# Patient Record
Sex: Female | Born: 1950
Health system: Southern US, Community
[De-identification: ages and names within clinical notes are randomized; demographics above are authoritative.]

## PROBLEM LIST (undated history)

## (undated) HISTORY — PX: FOOT SURGERY: SHX648

---

## 1998-12-27 ENCOUNTER — Other Ambulatory Visit: Admission: RE | Admit: 1998-12-27 | Discharge: 1998-12-27 | Payer: Self-pay | Admitting: Obstetrics and Gynecology

## 1999-12-24 ENCOUNTER — Encounter: Payer: Self-pay | Admitting: Family Medicine

## 1999-12-24 ENCOUNTER — Encounter: Admission: RE | Admit: 1999-12-24 | Discharge: 1999-12-24 | Payer: Self-pay | Admitting: Family Medicine

## 2000-01-06 ENCOUNTER — Encounter: Payer: Self-pay | Admitting: Family Medicine

## 2000-01-06 ENCOUNTER — Encounter: Admission: RE | Admit: 2000-01-06 | Discharge: 2000-01-06 | Payer: Self-pay | Admitting: Family Medicine

## 2000-01-17 ENCOUNTER — Encounter: Payer: Self-pay | Admitting: Obstetrics and Gynecology

## 2000-01-17 ENCOUNTER — Encounter: Admission: RE | Admit: 2000-01-17 | Discharge: 2000-01-17 | Payer: Self-pay | Admitting: Obstetrics and Gynecology

## 2000-01-21 ENCOUNTER — Other Ambulatory Visit: Admission: RE | Admit: 2000-01-21 | Discharge: 2000-01-21 | Payer: Self-pay | Admitting: Obstetrics and Gynecology

## 2000-02-05 ENCOUNTER — Ambulatory Visit (HOSPITAL_COMMUNITY): Admission: RE | Admit: 2000-02-05 | Discharge: 2000-02-05 | Payer: Self-pay | Admitting: Obstetrics and Gynecology

## 2000-04-30 ENCOUNTER — Ambulatory Visit (HOSPITAL_COMMUNITY): Admission: RE | Admit: 2000-04-30 | Discharge: 2000-04-30 | Payer: Self-pay | Admitting: Gastroenterology

## 2001-01-27 ENCOUNTER — Other Ambulatory Visit: Admission: RE | Admit: 2001-01-27 | Discharge: 2001-01-27 | Payer: Self-pay | Admitting: Obstetrics and Gynecology

## 2001-01-27 ENCOUNTER — Encounter: Admission: RE | Admit: 2001-01-27 | Discharge: 2001-01-27 | Payer: Self-pay | Admitting: Obstetrics and Gynecology

## 2001-01-27 ENCOUNTER — Encounter: Payer: Self-pay | Admitting: Obstetrics and Gynecology

## 2002-01-24 ENCOUNTER — Encounter: Payer: Self-pay | Admitting: Obstetrics and Gynecology

## 2002-01-24 ENCOUNTER — Encounter: Admission: RE | Admit: 2002-01-24 | Discharge: 2002-01-24 | Payer: Self-pay | Admitting: Obstetrics and Gynecology

## 2003-01-30 ENCOUNTER — Encounter: Admission: RE | Admit: 2003-01-30 | Discharge: 2003-01-30 | Payer: Self-pay | Admitting: Obstetrics and Gynecology

## 2003-01-30 ENCOUNTER — Encounter: Payer: Self-pay | Admitting: Obstetrics and Gynecology

## 2004-01-31 ENCOUNTER — Encounter: Admission: RE | Admit: 2004-01-31 | Discharge: 2004-01-31 | Payer: Self-pay | Admitting: Obstetrics and Gynecology

## 2004-08-20 ENCOUNTER — Encounter: Admission: RE | Admit: 2004-08-20 | Discharge: 2004-08-20 | Payer: Self-pay | Admitting: Family Medicine

## 2005-02-14 ENCOUNTER — Encounter: Admission: RE | Admit: 2005-02-14 | Discharge: 2005-02-14 | Payer: Self-pay | Admitting: Obstetrics and Gynecology

## 2005-07-13 ENCOUNTER — Emergency Department (HOSPITAL_COMMUNITY): Admission: EM | Admit: 2005-07-13 | Discharge: 2005-07-13 | Payer: Self-pay | Admitting: Family Medicine

## 2006-03-11 ENCOUNTER — Encounter: Admission: RE | Admit: 2006-03-11 | Discharge: 2006-03-11 | Payer: Self-pay | Admitting: Obstetrics and Gynecology

## 2007-05-31 ENCOUNTER — Emergency Department (HOSPITAL_COMMUNITY): Admission: EM | Admit: 2007-05-31 | Discharge: 2007-05-31 | Payer: Self-pay | Admitting: Family Medicine

## 2007-07-05 ENCOUNTER — Encounter: Admission: RE | Admit: 2007-07-05 | Discharge: 2007-07-05 | Payer: Self-pay | Admitting: Obstetrics and Gynecology

## 2007-09-08 ENCOUNTER — Encounter: Admission: RE | Admit: 2007-09-08 | Discharge: 2007-09-08 | Payer: Self-pay | Admitting: Family Medicine

## 2007-09-15 ENCOUNTER — Encounter (INDEPENDENT_AMBULATORY_CARE_PROVIDER_SITE_OTHER): Payer: Self-pay | Admitting: Interventional Radiology

## 2007-09-15 ENCOUNTER — Encounter: Admission: RE | Admit: 2007-09-15 | Discharge: 2007-09-15 | Payer: Self-pay | Admitting: Family Medicine

## 2007-09-15 ENCOUNTER — Other Ambulatory Visit: Admission: RE | Admit: 2007-09-15 | Discharge: 2007-09-15 | Payer: Self-pay | Admitting: Interventional Radiology

## 2008-07-06 ENCOUNTER — Encounter: Admission: RE | Admit: 2008-07-06 | Discharge: 2008-07-06 | Payer: Self-pay | Admitting: Obstetrics and Gynecology

## 2008-09-07 ENCOUNTER — Encounter: Admission: RE | Admit: 2008-09-07 | Discharge: 2008-09-07 | Payer: Self-pay | Admitting: Family Medicine

## 2008-09-25 ENCOUNTER — Emergency Department (HOSPITAL_COMMUNITY): Admission: EM | Admit: 2008-09-25 | Discharge: 2008-09-25 | Payer: Self-pay | Admitting: Emergency Medicine

## 2009-07-09 ENCOUNTER — Encounter: Admission: RE | Admit: 2009-07-09 | Discharge: 2009-07-09 | Payer: Self-pay | Admitting: Obstetrics and Gynecology

## 2009-07-29 ENCOUNTER — Emergency Department (HOSPITAL_COMMUNITY): Admission: EM | Admit: 2009-07-29 | Discharge: 2009-07-29 | Payer: Self-pay | Admitting: Emergency Medicine

## 2009-08-02 ENCOUNTER — Ambulatory Visit (HOSPITAL_COMMUNITY): Admission: RE | Admit: 2009-08-02 | Discharge: 2009-08-02 | Payer: Self-pay | Admitting: Urology

## 2010-07-11 ENCOUNTER — Encounter
Admission: RE | Admit: 2010-07-11 | Discharge: 2010-07-11 | Payer: Self-pay | Source: Home / Self Care | Attending: Family Medicine | Admitting: Family Medicine

## 2010-09-15 LAB — DIFFERENTIAL
Basophils Absolute: 0 10*3/uL (ref 0.0–0.1)
Eosinophils Absolute: 0 10*3/uL (ref 0.0–0.7)
Eosinophils Relative: 0 % (ref 0–5)
Lymphocytes Relative: 11 % — ABNORMAL LOW (ref 12–46)
Lymphs Abs: 1.2 10*3/uL (ref 0.7–4.0)
Monocytes Absolute: 0.5 10*3/uL (ref 0.1–1.0)

## 2010-09-15 LAB — URINALYSIS, ROUTINE W REFLEX MICROSCOPIC
Ketones, ur: NEGATIVE mg/dL
Specific Gravity, Urine: 1.011 (ref 1.005–1.030)

## 2010-09-15 LAB — COMPREHENSIVE METABOLIC PANEL
Albumin: 4.1 g/dL (ref 3.5–5.2)
Alkaline Phosphatase: 49 U/L (ref 39–117)
Chloride: 103 mEq/L (ref 96–112)
GFR calc Af Amer: >60 mL/min (ref >60)
GFR calc non Af Amer: >60 mL/min (ref >60)
Potassium: 4.5 mEq/L (ref 3.5–5.1)
Sodium: 138 mEq/L (ref 135–145)
Total Bilirubin: 0.5 mg/dL (ref 0.3–1.2)
Total Protein: 7.6 g/dL (ref 6.0–8.3)

## 2010-09-15 LAB — CBC
HCT: 38.9 % (ref 36.0–46.0)
Hemoglobin: 12.9 g/dL (ref 12.0–15.0)
Platelets: 233 10*3/uL (ref 150–400)
RBC: 4.17 MIL/uL (ref 3.87–5.11)
WBC: 10.4 10*3/uL (ref 4.0–10.5)

## 2010-09-15 LAB — URINE MICROSCOPIC-ADD ON

## 2011-06-30 ENCOUNTER — Other Ambulatory Visit: Payer: Self-pay | Admitting: Obstetrics and Gynecology

## 2011-06-30 DIAGNOSIS — Z1231 Encounter for screening mammogram for malignant neoplasm of breast: Secondary | ICD-10-CM

## 2011-07-07 IMAGING — MG MM DIGITAL SCREENING
4 series · 4 of 4 positions shown · non-contrast
Comparison: none

DG SCREEN MAMMOGRAM BILATERAL
Bilateral CC and MLO view(s) were taken.

DIGITAL SCREENING MAMMOGRAM WITH CAD:
There are scattered fibroglandular densities.  No masses or malignant type calcifications are 
identified.  Compared with prior studies.
Images were processed with CAD.

[R CC]
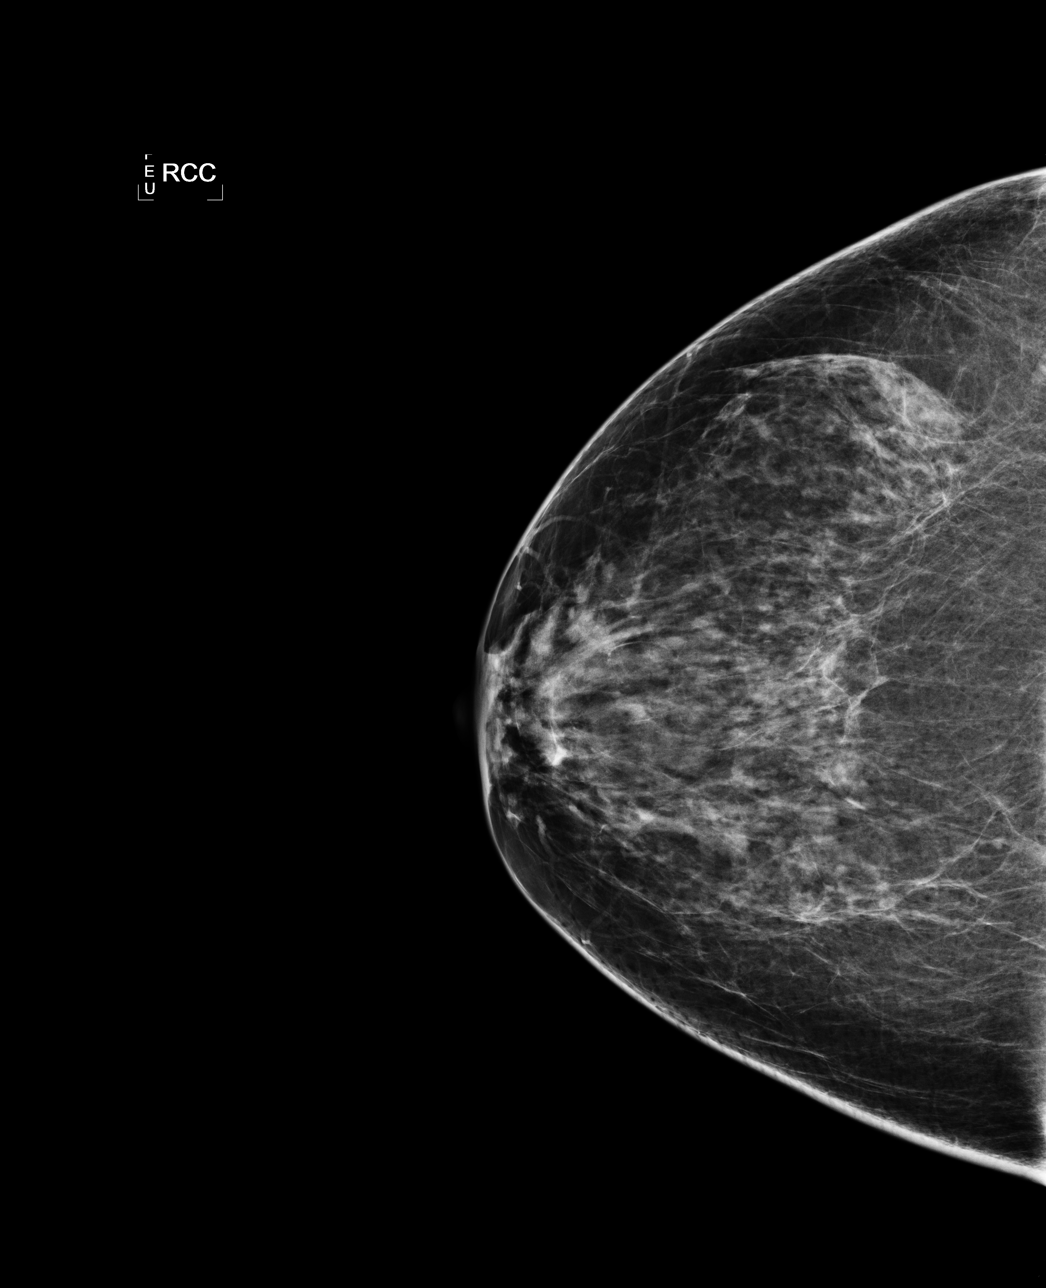

[L CC]
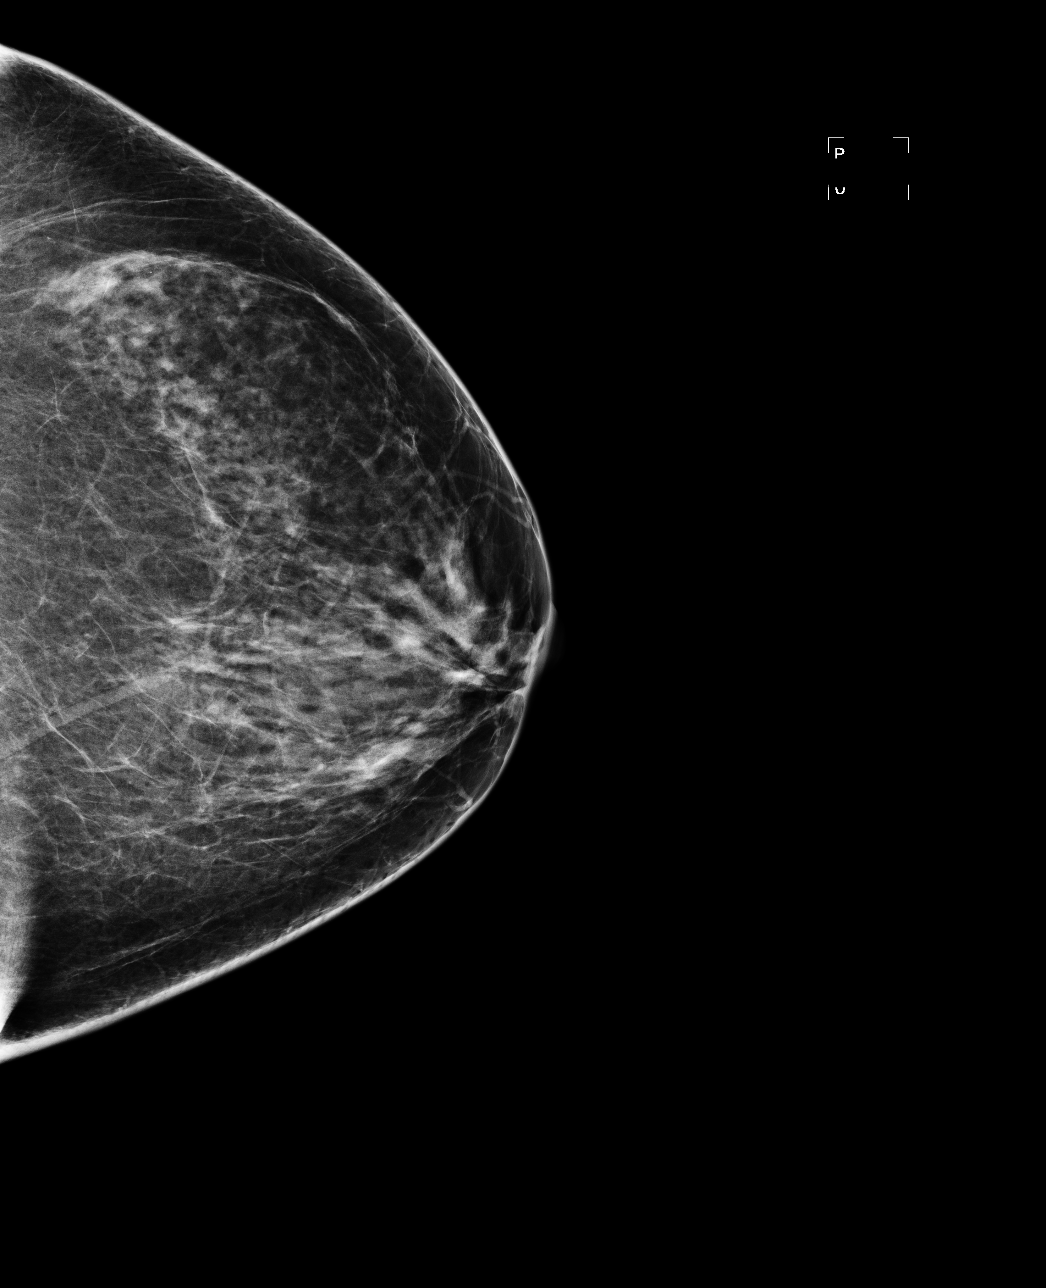

[L MLO]
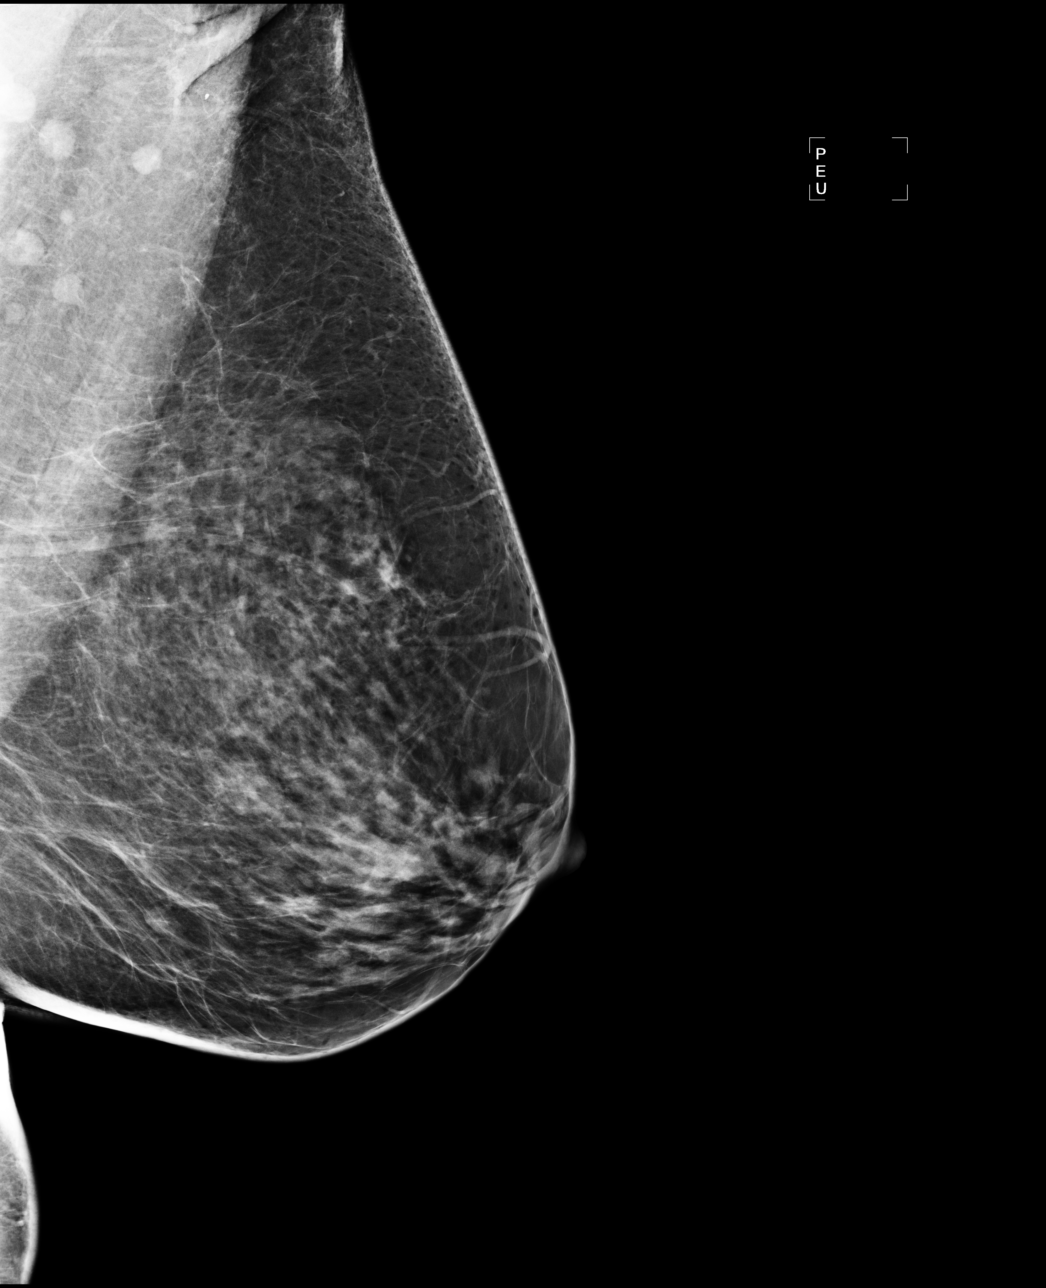

[R MLO]
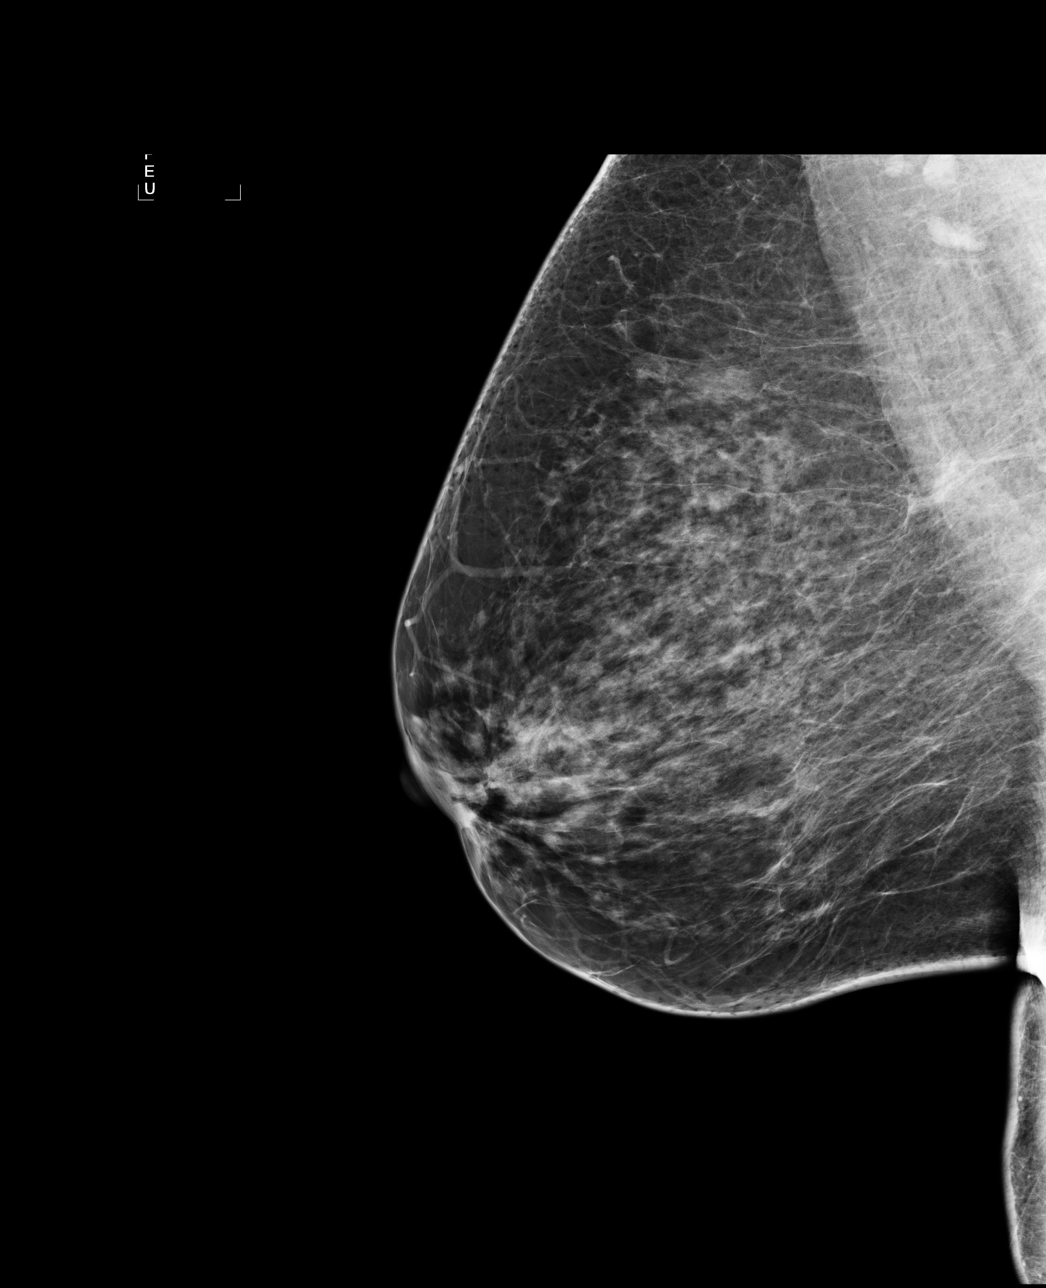

[4 of 4 positions shown; findings below may reference images not displayed]

IMPRESSION: No specific mammographic evidence of malignancy.  Next screening mammogram is recommended in one 
year.

A result letter of this screening mammogram will be mailed directly to the patient.

ASSESSMENT: Negative - BI-RADS 1

Screening mammogram in 1 year.
,

## 2011-08-01 ENCOUNTER — Ambulatory Visit
Admission: RE | Admit: 2011-08-01 | Discharge: 2011-08-01 | Disposition: A | Discharge status: Home / Self Care | Payer: 59 | Source: Ambulatory Visit | Attending: Obstetrics and Gynecology | Admitting: Obstetrics and Gynecology

## 2011-08-01 DIAGNOSIS — Z1231 Encounter for screening mammogram for malignant neoplasm of breast: Secondary | ICD-10-CM

## 2012-07-08 ENCOUNTER — Other Ambulatory Visit: Payer: Self-pay | Admitting: Obstetrics and Gynecology

## 2012-07-08 DIAGNOSIS — Z1231 Encounter for screening mammogram for malignant neoplasm of breast: Secondary | ICD-10-CM

## 2012-08-05 ENCOUNTER — Ambulatory Visit
Admission: RE | Admit: 2012-08-05 | Discharge: 2012-08-05 | Disposition: A | Discharge status: Home / Self Care | Payer: 59 | Source: Ambulatory Visit | Attending: Obstetrics and Gynecology | Admitting: Obstetrics and Gynecology

## 2012-08-05 DIAGNOSIS — Z1231 Encounter for screening mammogram for malignant neoplasm of breast: Secondary | ICD-10-CM

## 2012-10-21 ENCOUNTER — Other Ambulatory Visit: Payer: Self-pay | Admitting: Family Medicine

## 2012-10-21 DIAGNOSIS — E049 Nontoxic goiter, unspecified: Secondary | ICD-10-CM

## 2012-10-25 ENCOUNTER — Ambulatory Visit
Admission: RE | Admit: 2012-10-25 | Discharge: 2012-10-25 | Disposition: A | Discharge status: Home / Self Care | Payer: 59 | Source: Ambulatory Visit | Attending: Family Medicine | Admitting: Family Medicine

## 2012-10-25 DIAGNOSIS — E049 Nontoxic goiter, unspecified: Secondary | ICD-10-CM

## 2013-07-05 ENCOUNTER — Other Ambulatory Visit: Payer: Self-pay

## 2013-07-05 DIAGNOSIS — Z1231 Encounter for screening mammogram for malignant neoplasm of breast: Secondary | ICD-10-CM

## 2013-08-09 ENCOUNTER — Ambulatory Visit: Payer: 59

## 2014-10-26 ENCOUNTER — Other Ambulatory Visit: Payer: Self-pay | Admitting: Gastroenterology

## 2014-10-26 DIAGNOSIS — D122 Benign neoplasm of ascending colon: Secondary | ICD-10-CM | POA: Diagnosis not present

## 2015-09-05 ENCOUNTER — Other Ambulatory Visit (HOSPITAL_COMMUNITY): Payer: Self-pay | Admitting: Ophthalmology

## 2015-09-05 DIAGNOSIS — G453 Amaurosis fugax: Secondary | ICD-10-CM

## 2015-09-10 ENCOUNTER — Ambulatory Visit (HOSPITAL_COMMUNITY)
Admission: RE | Admit: 2015-09-10 | Discharge: 2015-09-10 | Disposition: A | Discharge status: Home / Self Care | Payer: Managed Care, Other (non HMO) | Source: Ambulatory Visit | Attending: Ophthalmology | Admitting: Ophthalmology

## 2015-09-10 DIAGNOSIS — G453 Amaurosis fugax: Secondary | ICD-10-CM | POA: Insufficient documentation

## 2015-09-10 NOTE — Progress Notes (Signed)
VASCULAR LAB PRELIMINARY  PRELIMINARY  PRELIMINARY  PRELIMINARY  Carotid duplex completed.    Preliminary report:  Bilateral - No obvious evidence of ICA stenosis. Vertebral artery flow is antegrade.  Doreatha Offer, RVS 09/10/2015, 1:08 PM

## 2015-12-29 DIAGNOSIS — H1033 Unspecified acute conjunctivitis, bilateral: Secondary | ICD-10-CM | POA: Diagnosis not present

## 2016-01-14 ENCOUNTER — Ambulatory Visit (HOSPITAL_COMMUNITY)
Admission: EM | Admit: 2016-01-14 | Discharge: 2016-01-14 | Disposition: A | Discharge status: Home / Self Care | Payer: Managed Care, Other (non HMO) | Attending: Family Medicine | Admitting: Family Medicine

## 2016-01-14 ENCOUNTER — Encounter (HOSPITAL_COMMUNITY): Payer: Self-pay | Admitting: Emergency Medicine

## 2016-01-14 DIAGNOSIS — L03119 Cellulitis of unspecified part of limb: Secondary | ICD-10-CM

## 2016-01-14 MED ORDER — CEPHALEXIN 500 MG PO CAPS: 500.0000 mg | ORAL_CAPSULE | Freq: Four times a day (QID) | ORAL | Status: DC

## 2016-01-14 MED ORDER — TRIAMCINOLONE ACETONIDE 40 MG/ML IJ SUSP
INTRAMUSCULAR | Status: AC
  Filled 2016-01-14: qty 1

## 2016-01-14 MED ORDER — FLUTICASONE PROPIONATE 0.05 % EX CREA: TOPICAL_CREAM | Freq: Two times a day (BID) | CUTANEOUS | Status: DC

## 2016-01-14 MED ORDER — TRIAMCINOLONE ACETONIDE 40 MG/ML IJ SUSP
40.0000 mg | Freq: Once | INTRAMUSCULAR | Status: AC
  Administered 2016-01-14: 40 mg via INTRAMUSCULAR

## 2016-01-14 NOTE — ED Notes (Signed)
Patient concerned for an insect bite on left arm.  Patient has been out of town at a reunion, staying in a motel in [place].  Yesterday noticed itching to left elbow and noticed red areas.  Since then redness has spread up left arm.

## 2016-01-14 NOTE — ED Provider Notes (Signed)
CSN: [id number]     Arrival date & time 01/14/16  1345 History   First MD Initiated Contact with Patient 01/14/16 1537     Chief Complaint  Patient presents with  . Insect Bite   (Consider location/radiation/quality/duration/timing/severity/associated sxs/prior Treatment) Patient is a 65 y.o. female presenting with rash. The history is provided by the patient.  Rash Location:  Shoulder/arm Shoulder/arm rash location:  L elbow Quality: itchiness, redness and swelling   Severity:  Moderate Onset quality:  Sudden Duration:  1 day Progression:  Spreading Chronicity:  New Context: insect bite/sting   Associated symptoms: fatigue   Associated symptoms: no fever, no joint pain and no myalgias     History reviewed. No pertinent past medical history. Past Surgical History  Procedure Laterality Date  . Foot surgery Left    History reviewed. No pertinent family history. Social History  Substance Use Topics  . Smoking status: Never Smoker   . Smokeless tobacco: None  . Alcohol Use: No   OB History    No data available     Review of Systems  Constitutional: Positive for fatigue. Negative for fever.  Musculoskeletal: Positive for joint swelling. Negative for myalgias and arthralgias.  Skin: Positive for rash.  All other systems reviewed and are negative.   Allergies  Review of patient's allergies indicates no known allergies.  Home Medications   Prior to Admission medications   Medication Sig Start Date End Date Taking? Authorizing Provider  B Complex-C (B-COMPLEX WITH VITAMIN C) tablet Take 1 tablet by mouth daily.   Yes Historical Provider, MD  Calcium-Magnesium-Vitamin D (CALCIUM 500 PO) Take by mouth.   Yes Historical Provider, MD  diphenhydrAMINE (BENADRYL) 25 MG tablet Take 25 mg by mouth every 6 (six) hours as needed.   Yes Historical Provider, MD  hydrocortisone cream 1 % Apply 1 application topically 2 (two) times daily.   Yes Historical Provider, MD  VITAMIN D,  CHOLECALCIFEROL, PO Take by mouth.   Yes Historical Provider, MD  cephALEXin (KEFLEX) 500 MG capsule Take 1 capsule (500 mg total) by mouth 4 (four) times daily. Take all of medicine and drink lots of fluids 01/14/16   Billy Fischer, MD  fluticasone (CUTIVATE) 0.05 % cream Apply topically 2 (two) times daily. 01/14/16   Billy Fischer, MD   Meds Ordered and Administered this Visit  Medications - No data to display  BP 142/85 mmHg  Pulse 65  Temp(Src) 98 F (36.7 C) (Oral)  Resp 12  SpO2 99% No data found.   Physical Exam  Constitutional: She is oriented to person, place, and time. She appears well-developed and well-nourished. No distress.  Musculoskeletal: She exhibits tenderness.  Neurological: She is alert and oriented to person, place, and time.  Skin: Skin is warm and dry. Rash noted. There is erythema.  sev isolated lesions with confluence over olecranon of red pruritic lesions, sl warm, nonpurulent. Sl olecranon effusion.  Nursing note and vitals reviewed.   ED Course  Procedures (including critical care time)  Labs Review Labs Reviewed - No data to display  Imaging Review No results found.   Visual Acuity Review  Right Eye Distance:   Left Eye Distance:   Bilateral Distance:    Right Eye Near:   Left Eye Near:    Bilateral Near:         MDM   1. Cellulitis of elbow        Billy Fischer, MD 01/14/16 [phone]

## 2016-01-14 NOTE — ED Notes (Signed)
Discharge delay secondary to medication administration

## 2016-01-24 ENCOUNTER — Other Ambulatory Visit: Payer: Self-pay | Admitting: Family Medicine

## 2016-01-24 DIAGNOSIS — Z23 Encounter for immunization: Secondary | ICD-10-CM | POA: Diagnosis not present

## 2016-01-24 DIAGNOSIS — E7800 Pure hypercholesterolemia, unspecified: Secondary | ICD-10-CM | POA: Diagnosis not present

## 2016-01-24 DIAGNOSIS — F411 Generalized anxiety disorder: Secondary | ICD-10-CM | POA: Diagnosis not present

## 2016-01-24 DIAGNOSIS — Z0000 Encounter for general adult medical examination without abnormal findings: Secondary | ICD-10-CM | POA: Diagnosis not present

## 2016-01-24 DIAGNOSIS — E041 Nontoxic single thyroid nodule: Secondary | ICD-10-CM | POA: Diagnosis not present

## 2016-01-24 DIAGNOSIS — G4700 Insomnia, unspecified: Secondary | ICD-10-CM | POA: Diagnosis not present

## 2016-01-24 DIAGNOSIS — E559 Vitamin D deficiency, unspecified: Secondary | ICD-10-CM | POA: Diagnosis not present

## 2016-01-24 DIAGNOSIS — K589 Irritable bowel syndrome without diarrhea: Secondary | ICD-10-CM | POA: Diagnosis not present

## 2016-01-24 DIAGNOSIS — D573 Sickle-cell trait: Secondary | ICD-10-CM | POA: Diagnosis not present

## 2016-01-24 DIAGNOSIS — M899 Disorder of bone, unspecified: Secondary | ICD-10-CM | POA: Diagnosis not present

## 2016-02-05 ENCOUNTER — Other Ambulatory Visit: Payer: Managed Care, Other (non HMO)

## 2016-02-05 ENCOUNTER — Ambulatory Visit
Admission: RE | Admit: 2016-02-05 | Discharge: 2016-02-05 | Disposition: A | Discharge status: Home / Self Care | Payer: Managed Care, Other (non HMO) | Source: Ambulatory Visit | Attending: Family Medicine | Admitting: Family Medicine

## 2016-02-05 DIAGNOSIS — E041 Nontoxic single thyroid nodule: Secondary | ICD-10-CM | POA: Diagnosis not present

## 2017-02-06 ENCOUNTER — Other Ambulatory Visit: Payer: Self-pay | Admitting: Family Medicine

## 2017-02-06 DIAGNOSIS — E041 Nontoxic single thyroid nodule: Secondary | ICD-10-CM

## 2017-02-11 ENCOUNTER — Other Ambulatory Visit: Payer: Managed Care, Other (non HMO)

## 2017-05-06 ENCOUNTER — Emergency Department (HOSPITAL_COMMUNITY)
Admission: EM | Admit: 2017-05-06 | Discharge: 2017-05-06 | Disposition: A | Discharge status: Home / Self Care | Payer: Medicare Other | Attending: Emergency Medicine | Admitting: Emergency Medicine

## 2017-05-06 ENCOUNTER — Emergency Department (HOSPITAL_COMMUNITY): Payer: Medicare Other

## 2017-05-06 ENCOUNTER — Other Ambulatory Visit: Payer: Self-pay

## 2017-05-06 ENCOUNTER — Encounter (HOSPITAL_COMMUNITY): Payer: Self-pay | Admitting: Emergency Medicine

## 2017-05-06 DIAGNOSIS — M25562 Pain in left knee: Secondary | ICD-10-CM | POA: Diagnosis not present

## 2017-05-06 DIAGNOSIS — Z79899 Other long term (current) drug therapy: Secondary | ICD-10-CM | POA: Insufficient documentation

## 2017-05-06 DIAGNOSIS — Z87891 Personal history of nicotine dependence: Secondary | ICD-10-CM | POA: Insufficient documentation

## 2017-05-06 NOTE — ED Provider Notes (Signed)
[facility] EMERGENCY DEPARTMENT Provider Note   CSN: [id number] Arrival date & time: 05/06/17  1227     History   Chief Complaint Chief Complaint  Patient presents with  . Knee Pain    HPI Jessica Lowery is a 66 y.o. female.  HPI   66 year-old female presents today with complaints of left knee pain.  Patient reports that she is very active.  She notes that she started doing water aerobics several months ago and developed left anterior based knee pain.  She notes that this is not worse with palpation, worse with movement or deep flexion.  She notes that she feels like there is something within the knee that is causing clicking and popping.  She notes the pain is sharp and quick and resolves with rest.  She denies any swelling or edema, denies any warmth.  Patient reports she has been using aspirin as needed for discomfort.  She denies any acute injuries.   History reviewed. No pertinent past medical history.  There are no active problems to display for this patient.   Past Surgical History:  Procedure Laterality Date  . FOOT SURGERY Left     OB History    No data available       Home Medications    Prior to Admission medications   Medication Sig Start Date End Date Taking? Authorizing Provider  B Complex-C (B-COMPLEX WITH VITAMIN C) tablet Take 1 tablet by mouth daily.    [provider]  Calcium-Magnesium-Vitamin D (CALCIUM 500 PO) Take by mouth.    [provider]  cephALEXin (KEFLEX) 500 MG capsule Take 1 capsule (500 mg total) by mouth 4 (four) times daily. Take all of medicine and drink lots of fluids 01/14/16   Billy Fischer, MD  diphenhydrAMINE (BENADRYL) 25 MG tablet Take 25 mg by mouth every 6 (six) hours as needed.    [provider]  fluticasone (CUTIVATE) 0.05 % cream Apply topically 2 (two) times daily. 01/14/16   Billy Fischer, MD  hydrocortisone cream 1 % Apply 1 application topically 2 (two) times daily.     [provider]  VITAMIN D, CHOLECALCIFEROL, PO Take by mouth.    [provider]    Family History No family history on file.  Social History Social History   Tobacco Use  . Smoking status: Former Smoker    Years: 24.00    Types: Cigarettes    Last attempt to quit: 06/30/1986    Years since quitting: 30.8  . Smokeless tobacco: Never Used  Substance Use Topics  . Alcohol use: No  . Drug use: No     Allergies   Patient has no known allergies.   Review of Systems Review of Systems  All other systems reviewed and are negative.    Physical Exam Updated Vital Signs BP 112/85 (BP Location: Left Arm)   Pulse 80   Temp 98.4 F (36.9 C) (Oral)   Resp 18   Ht 5\' 4"  (1.626 m)   Wt 68 kg (150 lb)   SpO2 99%   BMI 25.75 kg/m   Physical Exam  Constitutional: She is oriented to person, place, and time. She appears well-developed and well-nourished.  HENT:  Head: Normocephalic and atraumatic.  Eyes: Conjunctivae are normal. Pupils are equal, round, and reactive to light. Right eye exhibits no discharge. Left eye exhibits no discharge. No scleral icterus.  Neck: Normal range of motion. No JVD present. No tracheal deviation  present.  Pulmonary/Chest: Effort normal. No stridor.  Neurological: She is alert and oriented to person, place, and time. Coordination normal.  Psychiatric: She has a normal mood and affect. Her behavior is normal. Judgment and thought content normal.  Nursing note and vitals reviewed.    ED Treatments / Results  Labs (all labs ordered are listed, but only abnormal results are displayed) Labs Reviewed - No data to display  EKG  EKG Interpretation None       Radiology Dg Knee Complete 4 Views Left  Result Date: 05/06/2017 CLINICAL DATA:  Left knee pain following working out for several months, initial encounter EXAM: LEFT KNEE - COMPLETE 4+ VIEW COMPARISON:  None. FINDINGS: Mild degenerative changes are noted in all 3 joint  compartments. No joint effusion is seen. No acute fracture or dislocation is noted. Slight lateral displacement of the tibia with respect to the femur is noted. IMPRESSION: Degenerative change as described without acute abnormality. Electronically Signed   By: Inez Catalina M.D.   On: 05/06/2017 14:04    Procedures Procedures (including critical care time)  Medications Ordered in ED Medications - No data to display   Initial Impression / Assessment and Plan / ED Course  I have reviewed the triage vital signs and the nursing notes.  Pertinent labs & imaging results that were available during my care of the patient were reviewed by me and considered in my medical decision making (see chart for details).      Final Clinical Impressions(s) / ED Diagnoses   Final diagnoses:  Acute pain of left knee    Labs:   Imaging:  Consults:  Therapeutics:  Discharge Meds:   Assessment/Plan: 66 year old female presents today with complaints of knee pain.  This is likely overuse injury.  Patient has reassuring plain films, no swelling or edema, no signs of acute X, given crutches, strict return precautions.  She verbalized understanding and agreement to today's plan had no further questions or concerns at time discharge.    ED Discharge Orders    None       Okey Regal, Hershal Coria 05/06/17 1942    Tegeler, Gwenyth Allegra, MD 05/06/17 [phone]

## 2017-05-06 NOTE — ED Triage Notes (Signed)
Pt to ED c/o L knee pain x about 2 months. Worse with walking and going up and downstairs. Pt reports she went to the aquatic center for recumbent exercises and the gym following that before noticing the pain. States there was a certain machine you press your legs on and thinks that caused something to be loose in her knee. She reports rest and elevation help pain. Pain is medial and inferior to L kneecap. Ambulatory in triage.

## 2017-05-06 NOTE — Discharge Instructions (Signed)
Please read attached information. If you experience any new or worsening signs or symptoms please return to the emergency room for evaluation. Please follow-up with your primary care provider or specialist as discussed.  °

## 2017-06-03 ENCOUNTER — Encounter (INDEPENDENT_AMBULATORY_CARE_PROVIDER_SITE_OTHER): Payer: Self-pay | Admitting: Surgery

## 2017-06-03 ENCOUNTER — Ambulatory Visit (INDEPENDENT_AMBULATORY_CARE_PROVIDER_SITE_OTHER): Payer: Medicare Other | Admitting: Surgery

## 2017-06-03 DIAGNOSIS — G8929 Other chronic pain: Secondary | ICD-10-CM | POA: Diagnosis not present

## 2017-06-03 DIAGNOSIS — M25562 Pain in left knee: Secondary | ICD-10-CM | POA: Diagnosis not present

## 2017-06-03 MED ORDER — BUPIVACAINE HCL 0.25 % IJ SOLN
6.0000 mL | INTRAMUSCULAR | Status: AC | PRN
  Administered 2017-06-03: 6 mL via INTRA_ARTICULAR

## 2017-06-03 MED ORDER — LIDOCAINE HCL 1 % IJ SOLN
3.0000 mL | INTRAMUSCULAR | Status: AC | PRN
  Administered 2017-06-03: 3 mL

## 2017-06-03 MED ORDER — METHYLPREDNISOLONE ACETATE 40 MG/ML IJ SUSP
80.0000 mg | INTRAMUSCULAR | Status: AC | PRN
  Administered 2017-06-03: 80 mg

## 2017-06-03 NOTE — Progress Notes (Signed)
Office Visit Note   Patient: Jessica Lowery           Date of Birth: 10/10/1950           MRN: [id number] Visit Date: 06/03/2017              Requested by: Mayra Neer, MD [address] [place], [place] [address] PCP: Mayra Neer, MD   Assessment & Plan: Visit Diagnoses:  1. Chronic pain of left knee     Plan: In hopes of getting patient some relief offered aspiration and injection. Patient sent left knee was prepped with Betadine and about 25 mL of serous fluid was aspirated from a superior lateral patellar approach and an intra-articular Marcaine/Depo-Medrol injection was done. Tolerated procedure well without complication. After sitting for several minutes patient reported complete relief of her pain with Marcaine in place. Follow-up in 3 weeks for recheck but advised patient to call me in 2 weeks to let me know how she is feeling. If her pain returns she will let me know and I will schedule an MRI to evaluate the extent of her DJD and rule out medial meniscal tear. If scan is needed I will have her follow-up with Dr. Alphonzo Severance after to discuss results and treatment options.  Follow-Up Instructions: Return in about 3 weeks (around 06/24/2017) for with 66 Khalee Mazo.   Orders:  Orders Placed This Encounter  Procedures  . Large Joint Inj   No orders of the defined types were placed in this encounter.     Procedures: Large Joint Inj on 06/03/2017 9:46 AM Indications: joint swelling Details: 22 G needle, superolateral approach Medications: 3 mL lidocaine 1 %; 6 mL bupivacaine 0.25 %; 80 mg methylPREDNISolone acetate 40 MG/ML Aspirate: 25 mL serous Consent was given by the patient.       Clinical Data: No additional findings.   Subjective: Chief Complaint  Patient presents with  . Left Knee - Pain    HPI 66 year old white female comes in today with complaints of left knee pain. Patient states that about a year ago she was doing some water aerobics when  she felt a strain in her knee. A few months later she was at the gym doing leg presses a gamma she felt a sharp pain along the medial aspect. Since that time she discontinued have ongoing pain and swelling with a little mechanical symptoms. Denies any problems before initial injury. When pain gets bad she feels like it wants to give away at times. No lumbar spine, hip or radicular component.  Pain with ambulation, squatting, pivoting. She has limited her activity due to the ongoing problem. Review of Systems No current cardiopulmonary GI GU issues  Objective: Vital Signs: There were no vitals taken for this visit.  Physical Exam  Constitutional: She is oriented to person, place, and time. She appears well-developed. No distress.  HENT:  Head: Normocephalic and atraumatic.  Eyes: EOM are normal. Pupils are equal, round, and reactive to light.  Pulmonary/Chest: No respiratory distress.  Musculoskeletal:  Gait is antalgic. Negative logroll bilateral his. Negative straight leg raise. Left knee shows have swelling with 1-2+ effusion. She is exquisitely tender at the medial joint line. Tender medial plica. Mild patellofemoral crepitus. Positive McMurray's test. Cruciate and collateral ligaments are stable. Right knee unremarkable. Bilateral calves nontender. Neurovascular intact.  Neurological: She is alert and oriented to person, place, and time.    Ortho Exam  Specialty Comments:  No specialty comments available.  Imaging: No results found.   PMFS History: There are no active problems to display for this patient.  History reviewed. No pertinent past medical history.  History reviewed. No pertinent family history.  Past Surgical History:  Procedure Laterality Date  . FOOT SURGERY Left    Social History   Occupational History  . Not on file  Tobacco Use  . Smoking status: Former Smoker    Years: 24.00    Types: Cigarettes    Last attempt to quit: 06/30/1986    Years since  quitting: 30.9  . Smokeless tobacco: Never Used  Substance and Sexual Activity  . Alcohol use: No  . Drug use: No  . Sexual activity: Not on file

## 2017-06-24 ENCOUNTER — Other Ambulatory Visit: Payer: Self-pay | Admitting: Obstetrics and Gynecology

## 2017-06-24 ENCOUNTER — Ambulatory Visit (INDEPENDENT_AMBULATORY_CARE_PROVIDER_SITE_OTHER): Payer: Medicare Other | Admitting: Surgery

## 2017-06-24 ENCOUNTER — Encounter (INDEPENDENT_AMBULATORY_CARE_PROVIDER_SITE_OTHER): Payer: Self-pay | Admitting: Surgery

## 2017-06-24 VITALS — BP 153/90 | HR 77 | Ht 64.0 in | Wt 146.0 lb

## 2017-06-24 DIAGNOSIS — Z1231 Encounter for screening mammogram for malignant neoplasm of breast: Secondary | ICD-10-CM

## 2017-06-24 DIAGNOSIS — G8929 Other chronic pain: Secondary | ICD-10-CM

## 2017-06-24 DIAGNOSIS — M25562 Pain in left knee: Secondary | ICD-10-CM

## 2017-06-24 DIAGNOSIS — M79672 Pain in left foot: Secondary | ICD-10-CM

## 2017-06-24 NOTE — Progress Notes (Signed)
Office Visit Note   Patient: Jessica Lowery           Date of Birth: 12/16/1950           MRN: [id number] Visit Date: 06/24/2017              Requested by: Mayra Neer, MD [address] [place], [place] [address] PCP: Mayra Neer, MD   Assessment & Plan: Visit Diagnoses:  1. Chronic pain of left knee   2. Heel pain, chronic, left     Plan: With patient's ongoing left knee pain and mechanical symptoms we'll schedule MRI to evaluate the extent of her DJD and also rule out medial meniscal tear. I discussed case with Dr. Alphonzo Severance in our clinic and he will see patient after to review results. I advised patient of this. Regards to her ongoing chronic left posterior heel pain by exam this seems to be Achilles tendon tendinitis, pre-Achilles bursitis and some plantar fasciitis. Patient was given home stretching exercises. If she does not have any relief she may need further workup with MRI and x-rays. Dr. Marlou Sa can refer patient back to myself or our foot and ankle specialist Dr. Meridee Score. Patient given samples of Pennsaid to use for her posterior heel pain as well.  Follow-Up Instructions: Return in about 2 weeks (around 07/08/2017) for Dr Marlou Sa  to review mri left knee.   Orders:  Orders Placed This Encounter  Procedures  . MR Knee Left w/o contrast   No orders of the defined types were placed in this encounter.     Procedures: No procedures performed   Clinical Data: No additional findings.   Subjective: Chief Complaint  Patient presents with  . Left Knee - Pain, Follow-up    HPI Patient returns for evaluation of left knee pain. States that she had good temporary relief with previous intra-articular Marcaine/Depo-Medrol injection. Pain and mechanical symptoms have returned. Some swelling. Patient also brings up some issues with chronic left posterior heel pain. Has had this problem for several years after she had foot surgery with Dr. Weber Cooks.  Localizes pain to the pre-Achilles bursa and Achilles tendon insertion point. Soreness left plantar heel as well. Review of Systems No current cardiac pulmonary GI GU issues  Objective: Vital Signs: BP (!) 153/90   Pulse 77   Ht 5\' 4"  (1.626 m)   Wt 146 lb (66.2 kg)   BMI 25.06 kg/m   Physical Exam  Constitutional: She is oriented to person, place, and time. No distress.  HENT:  Head: Normocephalic and atraumatic.  Eyes: EOM are normal. Pupils are equal, round, and reactive to light.  Neck: Normal range of motion.  Pulmonary/Chest: No respiratory distress.  Musculoskeletal:  Exam left knee there is some swelling without large effusion. Good range of motion. Positive McMurray's test. Medial joint line tenderness. Tight heel cords bilaterally. On the left she is moderately tender over the pre-Achilles bursa and Achilles tendon down to the calcaneal insertion point. No obvious tendon defect. Bilateral tender over the plantar fascial calcaneal insertion. Right Achilles tendon less tender. Again no tendon defect. Bilateral calves are nontender. Neurovascular intact.  Neurological: She is alert and oriented to person, place, and time.  Skin: Skin is warm and dry.  Psychiatric: She has a normal mood and affect.    Ortho Exam  Specialty Comments:  No specialty comments available.  Imaging: No results found.   PMFS History: There are no active problems to display  for this patient.  History reviewed. No pertinent past medical history.  History reviewed. No pertinent family history.  Past Surgical History:  Procedure Laterality Date  . FOOT SURGERY Left    Social History   Occupational History  . Not on file  Tobacco Use  . Smoking status: Former Smoker    Years: 24.00    Types: Cigarettes    Last attempt to quit: 06/30/1986    Years since quitting: 31.0  . Smokeless tobacco: Never Used  Substance and Sexual Activity  . Alcohol use: No  . Drug use: No  . Sexual activity:  Not on file

## 2017-07-02 ENCOUNTER — Ambulatory Visit
Admission: RE | Admit: 2017-07-02 | Discharge: 2017-07-02 | Disposition: A | Discharge status: Home / Self Care | Payer: Medicare Other | Source: Ambulatory Visit | Attending: Surgery | Admitting: Surgery

## 2017-07-02 DIAGNOSIS — M25562 Pain in left knee: Secondary | ICD-10-CM | POA: Diagnosis not present

## 2017-07-02 DIAGNOSIS — G8929 Other chronic pain: Secondary | ICD-10-CM

## 2017-07-15 ENCOUNTER — Ambulatory Visit (INDEPENDENT_AMBULATORY_CARE_PROVIDER_SITE_OTHER): Payer: Medicare Other | Admitting: Orthopedic Surgery

## 2017-07-15 ENCOUNTER — Encounter (INDEPENDENT_AMBULATORY_CARE_PROVIDER_SITE_OTHER): Payer: Self-pay | Admitting: Orthopedic Surgery

## 2017-07-15 DIAGNOSIS — M1712 Unilateral primary osteoarthritis, left knee: Secondary | ICD-10-CM | POA: Diagnosis not present

## 2017-07-15 NOTE — Progress Notes (Signed)
Office Visit Note   Patient: Jessica Lowery           Date of Birth: 21-Dec-1950           MRN: [id number] Visit Date: 07/15/2017 Requested by: Mayra Neer, MD [address] [place], [place] [address] PCP: Mayra Neer, MD  Subjective: Chief Complaint  Patient presents with  . Left Knee - Follow-up    HPI: Melia is a patient with left knee pain who presents for follow-up after MRI scan to evaluate whether or not there is anything arthroscopically treatable in the left knee.  MRI scan shows medial and lateral meniscal pathology along with subchondral edema and bruising on the medial femoral condyle and medial tibial plateau.  Hard for the patient to do tandem stairs.  She does not have night pain or rest pain and is currently not taking any daily anti-inflammatory.  Denies any mechanical symptoms currently.  She has had one injection which helped              ROS: All systems reviewed are negative as they relate to the chief complaint within the history of present illness.  Patient denies  fevers or chills.   Assessment & Plan: Visit Diagnoses:  1. Unilateral primary osteoarthritis, left knee     Plan: Impression is left knee pain with severe medial compartment arthritis as well as some patellofemoral and lateral compartment arthritis.  Plan is episodic injections.  Nonweightbearing quad strengthening exercises.  Currently our decision point today was for or against arthroscopic intervention.  Scan shows not much in the way of pathology which we can treat arthroscopically.  We will continue with injections as needed and she may need a total knee replacement at some time in the future when her clinical symptoms worsen.  Follow-up as needed  Follow-Up Instructions: Return if symptoms worsen or fail to improve.   Orders:  No orders of the defined types were placed in this encounter.  No orders of the defined types were placed in this encounter.      Procedures: No procedures performed   Clinical Data: No additional findings.  Objective: Vital Signs: There were no vitals taken for this visit.  Physical Exam:   Constitutional: Patient appears well-developed HEENT:  Head: Normocephalic Eyes:EOM are normal Neck: Normal range of motion Cardiovascular: Normal rate Pulmonary/chest: Effort normal Neurologic: Patient is alert Skin: Skin is warm Psychiatric: Patient has normal mood and affect    Ortho Exam: Orthopedic exam demonstrates pretty normal gait and alignment with no effusion in the left knee.  Range of motion is excellent with no flexion contracture.  Collateral cruciate ligaments are stable pedal pulses palpable on the left.  Mild patellofemoral crepitus is present.  Specialty Comments:  No specialty comments available.  Imaging: No results found.   PMFS History: There are no active problems to display for this patient.  History reviewed. No pertinent past medical history.  History reviewed. No pertinent family history.  Past Surgical History:  Procedure Laterality Date  . FOOT SURGERY Left    Social History   Occupational History  . Not on file  Tobacco Use  . Smoking status: Former Smoker    Years: 24.00    Types: Cigarettes    Last attempt to quit: 06/30/1986    Years since quitting: 31.0  . Smokeless tobacco: Never Used  Substance and Sexual Activity  . Alcohol use: No  . Drug use: No  . Sexual activity: Not on  file

## 2017-08-24 ENCOUNTER — Telehealth (INDEPENDENT_AMBULATORY_CARE_PROVIDER_SITE_OTHER): Payer: Self-pay | Admitting: Orthopedic Surgery

## 2017-08-24 NOTE — Telephone Encounter (Signed)
Ok for 6 mo hc placard

## 2017-08-24 NOTE — Telephone Encounter (Signed)
You saw patient for the first time in January for knee arthritis with return as needed. Asking for handicap placard. Please advise. Thanks.

## 2017-08-24 NOTE — Telephone Encounter (Signed)
IC advised could pick up at front desk.  

## 2017-08-24 NOTE — Telephone Encounter (Signed)
Patient came into the clinic today requesting a handicap plaque.  CB#[phone].  Thank you.

## 2017-09-07 ENCOUNTER — Ambulatory Visit
Admission: RE | Admit: 2017-09-07 | Discharge: 2017-09-07 | Disposition: A | Discharge status: Home / Self Care | Payer: Medicare Other | Source: Ambulatory Visit | Attending: Obstetrics and Gynecology | Admitting: Obstetrics and Gynecology

## 2017-09-07 ENCOUNTER — Ambulatory Visit
Admission: RE | Admit: 2017-09-07 | Discharge: 2017-09-07 | Disposition: A | Discharge status: Home / Self Care | Payer: Medicare Other | Source: Ambulatory Visit | Attending: Family Medicine | Admitting: Family Medicine

## 2017-09-07 DIAGNOSIS — Z1231 Encounter for screening mammogram for malignant neoplasm of breast: Secondary | ICD-10-CM

## 2017-09-07 DIAGNOSIS — E041 Nontoxic single thyroid nodule: Secondary | ICD-10-CM

## 2017-09-07 DIAGNOSIS — E042 Nontoxic multinodular goiter: Secondary | ICD-10-CM | POA: Diagnosis not present

## 2017-09-11 DIAGNOSIS — H5213 Myopia, bilateral: Secondary | ICD-10-CM | POA: Diagnosis not present

## 2017-09-11 DIAGNOSIS — H2513 Age-related nuclear cataract, bilateral: Secondary | ICD-10-CM | POA: Diagnosis not present

## 2017-09-21 DIAGNOSIS — E041 Nontoxic single thyroid nodule: Secondary | ICD-10-CM | POA: Diagnosis not present

## 2017-09-21 DIAGNOSIS — F322 Major depressive disorder, single episode, severe without psychotic features: Secondary | ICD-10-CM | POA: Diagnosis not present

## 2017-09-21 DIAGNOSIS — R490 Dysphonia: Secondary | ICD-10-CM | POA: Diagnosis not present

## 2017-09-21 DIAGNOSIS — F411 Generalized anxiety disorder: Secondary | ICD-10-CM | POA: Diagnosis not present

## 2017-09-21 DIAGNOSIS — D573 Sickle-cell trait: Secondary | ICD-10-CM | POA: Diagnosis not present

## 2017-09-21 DIAGNOSIS — R9431 Abnormal electrocardiogram [ECG] [EKG]: Secondary | ICD-10-CM | POA: Diagnosis not present

## 2017-09-21 DIAGNOSIS — K219 Gastro-esophageal reflux disease without esophagitis: Secondary | ICD-10-CM | POA: Diagnosis not present

## 2017-09-21 DIAGNOSIS — E559 Vitamin D deficiency, unspecified: Secondary | ICD-10-CM | POA: Diagnosis not present

## 2017-09-21 DIAGNOSIS — K589 Irritable bowel syndrome without diarrhea: Secondary | ICD-10-CM | POA: Diagnosis not present

## 2017-09-21 DIAGNOSIS — E7800 Pure hypercholesterolemia, unspecified: Secondary | ICD-10-CM | POA: Diagnosis not present

## 2017-09-21 DIAGNOSIS — Z0000 Encounter for general adult medical examination without abnormal findings: Secondary | ICD-10-CM | POA: Diagnosis not present

## 2017-09-21 DIAGNOSIS — M899 Disorder of bone, unspecified: Secondary | ICD-10-CM | POA: Diagnosis not present

## 2017-09-30 DIAGNOSIS — R490 Dysphonia: Secondary | ICD-10-CM | POA: Diagnosis not present

## 2017-09-30 DIAGNOSIS — K219 Gastro-esophageal reflux disease without esophagitis: Secondary | ICD-10-CM | POA: Diagnosis not present

## 2017-11-05 DIAGNOSIS — F322 Major depressive disorder, single episode, severe without psychotic features: Secondary | ICD-10-CM | POA: Diagnosis not present

## 2017-12-08 DIAGNOSIS — R490 Dysphonia: Secondary | ICD-10-CM | POA: Diagnosis not present

## 2018-01-11 DIAGNOSIS — F322 Major depressive disorder, single episode, severe without psychotic features: Secondary | ICD-10-CM | POA: Diagnosis not present

## 2018-02-25 ENCOUNTER — Telehealth (INDEPENDENT_AMBULATORY_CARE_PROVIDER_SITE_OTHER): Payer: Self-pay | Admitting: Orthopedic Surgery

## 2018-02-25 NOTE — Telephone Encounter (Signed)
Patient requesting an updated handicap placard, hers expires the end of this month. Patients # [phone]

## 2018-02-25 NOTE — Telephone Encounter (Signed)
y

## 2018-02-25 NOTE — Telephone Encounter (Signed)
Ok to do

## 2018-02-26 NOTE — Telephone Encounter (Signed)
Done, IC patient and advised. LMVM

## 2018-08-16 ENCOUNTER — Telehealth (INDEPENDENT_AMBULATORY_CARE_PROVIDER_SITE_OTHER): Payer: Self-pay | Admitting: Orthopedic Surgery

## 2018-08-16 NOTE — Telephone Encounter (Signed)
Patient called needing a handicap placard. Patient said hers run out at the end of the month. The number to contact patient is [phone] or [phone]

## 2018-08-16 NOTE — Telephone Encounter (Signed)
[place] for placard?

## 2018-08-16 NOTE — Telephone Encounter (Signed)
Ok for placard? 

## 2018-08-17 NOTE — Telephone Encounter (Signed)
IC advised could pick up tomorrow

## 2018-08-24 ENCOUNTER — Other Ambulatory Visit: Payer: Self-pay | Admitting: Obstetrics and Gynecology

## 2018-08-24 ENCOUNTER — Other Ambulatory Visit: Payer: Self-pay | Admitting: Family Medicine

## 2018-08-24 DIAGNOSIS — Z1231 Encounter for screening mammogram for malignant neoplasm of breast: Secondary | ICD-10-CM

## 2018-10-01 ENCOUNTER — Ambulatory Visit: Payer: Medicare Other

## 2018-12-13 DIAGNOSIS — H25011 Cortical age-related cataract, right eye: Secondary | ICD-10-CM | POA: Diagnosis not present

## 2018-12-13 DIAGNOSIS — H40023 Open angle with borderline findings, high risk, bilateral: Secondary | ICD-10-CM | POA: Diagnosis not present

## 2018-12-13 DIAGNOSIS — H5213 Myopia, bilateral: Secondary | ICD-10-CM | POA: Diagnosis not present

## 2018-12-13 DIAGNOSIS — H2512 Age-related nuclear cataract, left eye: Secondary | ICD-10-CM | POA: Diagnosis not present

## 2019-01-13 ENCOUNTER — Other Ambulatory Visit: Payer: Self-pay

## 2019-01-13 ENCOUNTER — Ambulatory Visit
Admission: RE | Admit: 2019-01-13 | Discharge: 2019-01-13 | Disposition: A | Discharge status: Home / Self Care | Payer: Medicare Other | Source: Ambulatory Visit | Attending: Family Medicine | Admitting: Family Medicine

## 2019-01-13 DIAGNOSIS — Z1231 Encounter for screening mammogram for malignant neoplasm of breast: Secondary | ICD-10-CM | POA: Diagnosis not present

## 2019-03-01 DIAGNOSIS — E041 Nontoxic single thyroid nodule: Secondary | ICD-10-CM | POA: Diagnosis not present

## 2019-03-01 DIAGNOSIS — E559 Vitamin D deficiency, unspecified: Secondary | ICD-10-CM | POA: Diagnosis not present

## 2019-03-01 DIAGNOSIS — E7800 Pure hypercholesterolemia, unspecified: Secondary | ICD-10-CM | POA: Diagnosis not present

## 2019-03-08 DIAGNOSIS — E041 Nontoxic single thyroid nodule: Secondary | ICD-10-CM | POA: Diagnosis not present

## 2019-03-08 DIAGNOSIS — E7800 Pure hypercholesterolemia, unspecified: Secondary | ICD-10-CM | POA: Diagnosis not present

## 2019-03-08 DIAGNOSIS — Z0000 Encounter for general adult medical examination without abnormal findings: Secondary | ICD-10-CM | POA: Diagnosis not present

## 2019-03-08 DIAGNOSIS — F322 Major depressive disorder, single episode, severe without psychotic features: Secondary | ICD-10-CM | POA: Diagnosis not present

## 2019-03-08 DIAGNOSIS — Z7189 Other specified counseling: Secondary | ICD-10-CM | POA: Diagnosis not present

## 2019-03-08 DIAGNOSIS — E559 Vitamin D deficiency, unspecified: Secondary | ICD-10-CM | POA: Diagnosis not present

## 2019-03-08 DIAGNOSIS — M899 Disorder of bone, unspecified: Secondary | ICD-10-CM | POA: Diagnosis not present

## 2019-03-08 DIAGNOSIS — D573 Sickle-cell trait: Secondary | ICD-10-CM | POA: Diagnosis not present

## 2019-09-14 DIAGNOSIS — N644 Mastodynia: Secondary | ICD-10-CM | POA: Diagnosis not present

## 2019-09-15 ENCOUNTER — Other Ambulatory Visit: Payer: Self-pay | Admitting: Family Medicine

## 2019-09-15 DIAGNOSIS — N644 Mastodynia: Secondary | ICD-10-CM

## 2019-09-28 ENCOUNTER — Other Ambulatory Visit: Payer: Self-pay | Admitting: Family Medicine

## 2019-09-28 ENCOUNTER — Other Ambulatory Visit: Payer: Self-pay

## 2019-09-28 ENCOUNTER — Ambulatory Visit
Admission: RE | Admit: 2019-09-28 | Discharge: 2019-09-28 | Disposition: A | Discharge status: Home / Self Care | Payer: Medicare Other | Source: Ambulatory Visit | Attending: Family Medicine | Admitting: Family Medicine

## 2019-09-28 DIAGNOSIS — N644 Mastodynia: Secondary | ICD-10-CM

## 2019-09-28 DIAGNOSIS — R928 Other abnormal and inconclusive findings on diagnostic imaging of breast: Secondary | ICD-10-CM | POA: Diagnosis not present

## 2019-09-28 DIAGNOSIS — N6489 Other specified disorders of breast: Secondary | ICD-10-CM | POA: Diagnosis not present

## 2019-09-28 DIAGNOSIS — R599 Enlarged lymph nodes, unspecified: Secondary | ICD-10-CM

## 2019-10-05 ENCOUNTER — Other Ambulatory Visit: Payer: Medicare Other

## 2019-10-31 DIAGNOSIS — H10413 Chronic giant papillary conjunctivitis, bilateral: Secondary | ICD-10-CM | POA: Diagnosis not present

## 2020-01-16 ENCOUNTER — Ambulatory Visit
Admission: RE | Admit: 2020-01-16 | Discharge: 2020-01-16 | Disposition: A | Discharge status: Home / Self Care | Payer: Medicare Other | Source: Ambulatory Visit | Attending: Family Medicine | Admitting: Family Medicine

## 2020-01-16 ENCOUNTER — Other Ambulatory Visit: Payer: Self-pay

## 2020-01-16 DIAGNOSIS — R599 Enlarged lymph nodes, unspecified: Secondary | ICD-10-CM

## 2020-01-16 DIAGNOSIS — N6489 Other specified disorders of breast: Secondary | ICD-10-CM | POA: Diagnosis not present

## 2020-01-16 DIAGNOSIS — R928 Other abnormal and inconclusive findings on diagnostic imaging of breast: Secondary | ICD-10-CM | POA: Diagnosis not present

## 2020-01-18 ENCOUNTER — Ambulatory Visit (INDEPENDENT_AMBULATORY_CARE_PROVIDER_SITE_OTHER): Payer: Medicare Other

## 2020-01-18 ENCOUNTER — Ambulatory Visit (INDEPENDENT_AMBULATORY_CARE_PROVIDER_SITE_OTHER): Payer: Medicare Other | Admitting: Orthopedic Surgery

## 2020-01-18 VITALS — Ht 63.75 in | Wt 159.0 lb

## 2020-01-18 DIAGNOSIS — M25562 Pain in left knee: Secondary | ICD-10-CM

## 2020-01-18 DIAGNOSIS — M1712 Unilateral primary osteoarthritis, left knee: Secondary | ICD-10-CM

## 2020-01-21 ENCOUNTER — Encounter: Payer: Self-pay | Admitting: Orthopedic Surgery

## 2020-01-21 DIAGNOSIS — M1712 Unilateral primary osteoarthritis, left knee: Secondary | ICD-10-CM

## 2020-01-21 MED ORDER — LIDOCAINE HCL 1 % IJ SOLN
5.0000 mL | INTRAMUSCULAR | Status: AC | PRN
  Administered 2020-01-21: 5 mL

## 2020-01-21 MED ORDER — BUPIVACAINE HCL 0.25 % IJ SOLN
4.0000 mL | INTRAMUSCULAR | Status: AC | PRN
  Administered 2020-01-21: 4 mL via INTRA_ARTICULAR

## 2020-01-21 MED ORDER — METHYLPREDNISOLONE ACETATE 40 MG/ML IJ SUSP
40.0000 mg | INTRAMUSCULAR | Status: AC | PRN
  Administered 2020-01-21: 40 mg via INTRA_ARTICULAR

## 2020-01-21 NOTE — Progress Notes (Signed)
Office Visit Note   Patient: Jessica Lowery           Date of Birth: [birth year]           MRN: [id number] Visit Date: 01/18/2020 Requested by: Mayra Neer, MD [address] [place],  [place] [address] PCP: Mayra Neer, MD  Subjective: Chief Complaint  Patient presents with  . Left Knee - Pain    HPI: Jessica Lowery is a 69 y.o. female who presents to the office complaining of left knee pain.  Patient was last seen in 2019 for left knee pain where she received an aspiration/injection with good relief.  She notes recent return of left knee pain over the last several months.  She denies any acute injury.  Pain does not wake her up at night.  She denies any mechanical symptoms though her knee does give out on her on occasion.  Denies any groin pain, numbness/tingling of the left leg, weakness of the left leg.  No history of left knee surgery.  She does note pain is worse after significant walking such as going to the grocery store.  She does not take any medications for her pain.  She requests repeat aspiration/injection.  No history of diabetes.                ROS: All systems reviewed are negative as they relate to the chief complaint within the history of present illness.  Patient denies fevers or chills.  Assessment & Plan: Visit Diagnoses:  1. Left knee pain, unspecified chronicity     Plan: Patient is a 69 year old female presents complaining of left knee pain.  She has history of left knee osteoarthritis.  She was last seen in 2019 with good relief with left knee aspiration/injection with cortisone.  She has no history of left knee surgery.  She does not take any medications for management of her pain.  Radiographs taken today reveal no fractures but she does have severe degenerative changes, worst in the medial compartment..  Left knee was aspirated and injected with cortisone today.  Patient tolerated procedure well.  Follow-up as needed.  Follow-Up Instructions:  No follow-ups on file.   Orders:  Orders Placed This Encounter  Procedures  . XR KNEE 3 VIEW LEFT   No orders of the defined types were placed in this encounter.     Procedures: Large Joint Inj: L knee on 01/21/2020 10:21 PM Indications: diagnostic evaluation, joint swelling and pain Details: 18 G 1.5 in needle, superolateral approach  Arthrogram: No  Medications: 5 mL lidocaine 1 %; 40 mg methylPREDNISolone acetate 40 MG/ML; 4 mL bupivacaine 0.25 % Outcome: tolerated well, no immediate complications Procedure, treatment alternatives, risks and benefits explained, specific risks discussed. Consent was given by the patient. Immediately prior to procedure a time out was called to verify the correct patient, procedure, equipment, support staff and site/side marked as required. Patient was prepped and draped in the usual sterile fashion.       Clinical Data: No additional findings.  Objective: Vital Signs: Ht 5' 3.75" (1.619 m)   Wt 159 lb (72.1 kg)   BMI 27.51 kg/m   Physical Exam:  Constitutional: Patient appears well-developed HEENT:  Head: Normocephalic Eyes:EOM are normal Neck: Normal range of motion Cardiovascular: Normal rate Pulmonary/chest: Effort normal Neurologic: Patient is alert Skin: Skin is warm Psychiatric: Patient has normal mood and affect  Ortho Exam: Orthopedic exam demonstrates tenderness to palpation over the medial lateral joint  lines.  Mild effusion is present.  Extensor mechanism is intact.  No ligamentous laxity on exam.  No groin pain elicited with hip range of motion.  Negative straight leg raise.  Specialty Comments:  No specialty comments available.  Imaging: No results found.   PMFS History: There are no problems to display for this patient.  No past medical history on file.  No family history on file.  Past Surgical History:  Procedure Laterality Date  . FOOT SURGERY Left    Social History   Occupational History  . Not on  file  Tobacco Use  . Smoking status: Former Smoker    Years: 24.00    Types: Cigarettes    Quit date: 06/30/1986    Years since quitting: 33.5  . Smokeless tobacco: Never Used  Substance and Sexual Activity  . Alcohol use: No  . Drug use: No  . Sexual activity: Not on file

## 2020-03-14 DIAGNOSIS — E041 Nontoxic single thyroid nodule: Secondary | ICD-10-CM | POA: Diagnosis not present

## 2020-03-14 DIAGNOSIS — Z0000 Encounter for general adult medical examination without abnormal findings: Secondary | ICD-10-CM | POA: Diagnosis not present

## 2020-03-14 DIAGNOSIS — M899 Disorder of bone, unspecified: Secondary | ICD-10-CM | POA: Diagnosis not present

## 2020-03-14 DIAGNOSIS — F322 Major depressive disorder, single episode, severe without psychotic features: Secondary | ICD-10-CM | POA: Diagnosis not present

## 2020-03-14 DIAGNOSIS — E559 Vitamin D deficiency, unspecified: Secondary | ICD-10-CM | POA: Diagnosis not present

## 2020-03-14 DIAGNOSIS — D573 Sickle-cell trait: Secondary | ICD-10-CM | POA: Diagnosis not present

## 2020-03-14 DIAGNOSIS — E7800 Pure hypercholesterolemia, unspecified: Secondary | ICD-10-CM | POA: Diagnosis not present

## 2020-03-15 ENCOUNTER — Other Ambulatory Visit: Payer: Self-pay | Admitting: Family Medicine

## 2020-03-15 DIAGNOSIS — M8580 Other specified disorders of bone density and structure, unspecified site: Secondary | ICD-10-CM

## 2020-04-23 ENCOUNTER — Ambulatory Visit (HOSPITAL_COMMUNITY)
Admission: EM | Admit: 2020-04-23 | Discharge: 2020-04-23 | Disposition: A | Discharge status: Home / Self Care | Payer: Medicare Other

## 2020-04-23 ENCOUNTER — Other Ambulatory Visit: Payer: Self-pay

## 2020-04-23 ENCOUNTER — Encounter (HOSPITAL_COMMUNITY): Payer: Self-pay | Admitting: *Deleted

## 2020-04-23 DIAGNOSIS — B029 Zoster without complications: Secondary | ICD-10-CM

## 2020-04-23 MED ORDER — ACETAMINOPHEN 500 MG PO TABS
500.0000 mg | ORAL_TABLET | Freq: Three times a day (TID) | ORAL | 0 refills | Status: AC | PRN
Start: 2020-04-23 — End: ?

## 2020-04-23 MED ORDER — IBUPROFEN 400 MG PO TABS
400.0000 mg | ORAL_TABLET | Freq: Three times a day (TID) | ORAL | 0 refills | Status: DC | PRN
Start: 2020-04-23 — End: 2021-01-17

## 2020-04-23 MED ORDER — VALACYCLOVIR HCL 1 G PO TABS: 1000.0000 mg | ORAL_TABLET | Freq: Three times a day (TID) | ORAL | 0 refills | Status: AC

## 2020-04-23 NOTE — ED Triage Notes (Signed)
Patient in with complaints of insect bite(unknown insect) x 1 week. Small red bumps noted to the right side of leg below knee, lower part of leg above ankle and foot. Patient states that the areas itch and she has been using nystatin and triamcinolone cream to the areas. Patient states that on last night she began to have a burning sensation in right leg. Patient denies pain at this time.

## 2020-04-23 NOTE — Discharge Instructions (Signed)
Treating you for shingles rash Medicines as prescribed Follow up as needed for continued or worsening symptoms

## 2020-04-23 NOTE — ED Provider Notes (Signed)
[facility]    CSN: [id number] Arrival date & time: 04/23/20  0857      History   Chief Complaint Chief Complaint  Patient presents with  . Insect Bite  . Rash    HPI Jessica Lowery is a 69 y.o. female.   Patient is a 69 year old female presents today with a rash.  Rash is located in the right lower extremity.  This is been present for approximately 5 days.  Describes rash is mildly itchy but somewhat painful and burning pain in the leg.  She has been using triamcinolone cream on the areas which has not seemed to help.  Denies any fever. Denies any recent changes in lotions, detergents, foods or other possible irritants. No recent travel. Nobody else at home has the rash. Patient has been outside but denies any contact with plants or insects. No new foods or medications.        History reviewed. No pertinent past medical history.  There are no problems to display for this patient.   Past Surgical History:  Procedure Laterality Date  . FOOT SURGERY Left     OB History   No obstetric history on file.      Home Medications    Prior to Admission medications   Medication Sig Start Date End Date Taking? Authorizing Provider  Biotin 1000 MCG CHEW Chew by mouth.   Yes [provider]  Calcium-Magnesium-Vitamin D (CALCIUM 500 PO) Take by mouth.   Yes [provider]  FLUoxetine (PROZAC) 10 MG tablet Take 10 mg by mouth daily. 01/31/20  Yes [provider]  acetaminophen (TYLENOL) 500 MG tablet Take 1 tablet (500 mg total) by mouth every 8 (eight) hours as needed. 04/23/20   Hayle Parisi, Tressia Miners A, NP  B Complex-C (B-COMPLEX WITH VITAMIN C) tablet Take 1 tablet by mouth daily.    [provider]  diphenhydrAMINE (BENADRYL) 25 MG tablet Take 25 mg by mouth every 6 (six) hours as needed.    [provider]  hydrocortisone cream 1 % Apply 1 application topically 2 (two) times daily.    [provider]  ibuprofen (ADVIL)  400 MG tablet Take 1 tablet (400 mg total) by mouth every 8 (eight) hours as needed. 04/23/20   Loura Halt A, NP  valACYclovir (VALTREX) 1000 MG tablet Take 1 tablet (1,000 mg total) by mouth 3 (three) times daily. 04/23/20   Orvan July, NP    Family History Family History  Problem Relation Age of Onset  . Dementia Mother     Social History Social History   Tobacco Use  . Smoking status: Former Smoker    Years: 24.00    Types: Cigarettes    Quit date: 06/30/1986    Years since quitting: 33.8  . Smokeless tobacco: Never Used  Vaping Use  . Vaping Use: Never used  Substance Use Topics  . Alcohol use: No  . Drug use: No     Allergies   Patient has no known allergies.   Review of Systems Review of Systems   Physical Exam Triage Vital Signs ED Triage Vitals  Enc Vitals Group     BP 04/23/20 0940 (!) 145/92     Pulse Rate 04/23/20 0940 60     Resp 04/23/20 0940 16     Temp 04/23/20 0940 98.9 F (37.2 C)     Temp Source 04/23/20 0940 Oral     SpO2 04/23/20 0940 97 %     Weight --  Height --      Head Circumference --      Peak Flow --      Pain Score 04/23/20 0939 0     Pain Loc --      Pain Edu? --      Excl. in [place]? --    No data found.  Updated Vital Signs BP (!) 145/92 (BP Location: Right Arm)   Pulse 60   Temp 98.9 F (37.2 C) (Oral)   Resp 16   SpO2 97%   Visual Acuity Right Eye Distance:   Left Eye Distance:   Bilateral Distance:    Right Eye Near:   Left Eye Near:    Bilateral Near:     Physical Exam Vitals and nursing note reviewed.  Constitutional:      General: She is not in acute distress.    Appearance: Normal appearance. She is not ill-appearing, toxic-appearing or diaphoretic.  HENT:     Head: Normocephalic.     Nose: Nose normal.  Eyes:     Conjunctiva/sclera: Conjunctivae normal.  Pulmonary:     Effort: Pulmonary effort is normal.  Musculoskeletal:        General: Normal range of motion.     Cervical back: Normal  range of motion.  Skin:    General: Skin is warm and dry.     Findings: Rash present.          Comments: Papular erythematous rash, clustered located to right lower leg and foot  Neurological:     Mental Status: She is alert.  Psychiatric:        Mood and Affect: Mood normal.      UC Treatments / Results  Labs (all labs ordered are listed, but only abnormal results are displayed) Labs Reviewed - No data to display  EKG   Radiology No results found.  Procedures Procedures (including critical care time)  Medications Ordered in UC Medications - No data to display  Initial Impression / Assessment and Plan / UC Course  I have reviewed the triage vital signs and the nursing notes.  Pertinent labs & imaging results that were available during my care of the patient were reviewed by me and considered in my medical decision making (see chart for details).     Rash consistent with herpes zoster Treating with Valtrex. Tylenol and ibuprofen for pain as needed Follow up as needed for continued or worsening symptoms  Final Clinical Impressions(s) / UC Diagnoses   Final diagnoses:  Herpes zoster without complication     Discharge Instructions     Treating you for shingles rash Medicines as prescribed Follow up as needed for continued or worsening symptoms     ED Prescriptions    Medication Sig Dispense Auth. Provider   valACYclovir (VALTREX) 1000 MG tablet Take 1 tablet (1,000 mg total) by mouth 3 (three) times daily. 21 tablet Tynlee Bayle A, NP   acetaminophen (TYLENOL) 500 MG tablet Take 1 tablet (500 mg total) by mouth every 8 (eight) hours as needed. 30 tablet Azariah Latendresse A, NP   ibuprofen (ADVIL) 400 MG tablet Take 1 tablet (400 mg total) by mouth every 8 (eight) hours as needed. 30 tablet Loura Halt A, NP     PDMP not reviewed this encounter.   Orvan July, NP 04/23/20 1039

## 2020-06-05 ENCOUNTER — Other Ambulatory Visit: Payer: Self-pay

## 2020-06-05 ENCOUNTER — Ambulatory Visit
Admission: RE | Admit: 2020-06-05 | Discharge: 2020-06-05 | Disposition: A | Discharge status: Home / Self Care | Payer: Medicare Other | Source: Ambulatory Visit | Attending: Family Medicine | Admitting: Family Medicine

## 2020-06-05 DIAGNOSIS — M8580 Other specified disorders of bone density and structure, unspecified site: Secondary | ICD-10-CM

## 2020-06-05 DIAGNOSIS — Z13820 Encounter for screening for osteoporosis: Secondary | ICD-10-CM | POA: Diagnosis not present

## 2020-06-14 DIAGNOSIS — Z1159 Encounter for screening for other viral diseases: Secondary | ICD-10-CM | POA: Diagnosis not present

## 2020-06-20 DIAGNOSIS — K640 First degree hemorrhoids: Secondary | ICD-10-CM | POA: Diagnosis not present

## 2020-06-20 DIAGNOSIS — Z86010 Personal history of colonic polyps: Secondary | ICD-10-CM | POA: Diagnosis not present

## 2020-12-17 ENCOUNTER — Other Ambulatory Visit: Payer: Self-pay | Admitting: Family Medicine

## 2020-12-17 DIAGNOSIS — Z1231 Encounter for screening mammogram for malignant neoplasm of breast: Secondary | ICD-10-CM

## 2020-12-18 ENCOUNTER — Ambulatory Visit: Payer: Medicare Other

## 2021-01-10 DIAGNOSIS — R110 Nausea: Secondary | ICD-10-CM | POA: Diagnosis not present

## 2021-01-10 DIAGNOSIS — S9301XA Subluxation of right ankle joint, initial encounter: Secondary | ICD-10-CM | POA: Diagnosis not present

## 2021-01-10 DIAGNOSIS — R519 Headache, unspecified: Secondary | ICD-10-CM | POA: Diagnosis not present

## 2021-01-10 DIAGNOSIS — R42 Dizziness and giddiness: Secondary | ICD-10-CM | POA: Diagnosis not present

## 2021-01-10 DIAGNOSIS — Y92008 Other place in unspecified non-institutional (private) residence as the place of occurrence of the external cause: Secondary | ICD-10-CM | POA: Diagnosis not present

## 2021-01-10 DIAGNOSIS — S82831A Other fracture of upper and lower end of right fibula, initial encounter for closed fracture: Secondary | ICD-10-CM | POA: Diagnosis not present

## 2021-01-10 DIAGNOSIS — Z743 Need for continuous supervision: Secondary | ICD-10-CM | POA: Diagnosis not present

## 2021-01-10 DIAGNOSIS — S93431A Sprain of tibiofibular ligament of right ankle, initial encounter: Secondary | ICD-10-CM | POA: Diagnosis not present

## 2021-01-10 DIAGNOSIS — S8261XA Displaced fracture of lateral malleolus of right fibula, initial encounter for closed fracture: Secondary | ICD-10-CM | POA: Diagnosis not present

## 2021-01-10 DIAGNOSIS — S8291XA Unspecified fracture of right lower leg, initial encounter for closed fracture: Secondary | ICD-10-CM | POA: Diagnosis not present

## 2021-01-10 DIAGNOSIS — R55 Syncope and collapse: Secondary | ICD-10-CM | POA: Diagnosis not present

## 2021-01-10 DIAGNOSIS — W108XXA Fall (on) (from) other stairs and steps, initial encounter: Secondary | ICD-10-CM | POA: Diagnosis not present

## 2021-01-10 DIAGNOSIS — S92321A Displaced fracture of second metatarsal bone, right foot, initial encounter for closed fracture: Secondary | ICD-10-CM | POA: Diagnosis not present

## 2021-01-10 DIAGNOSIS — S82851A Displaced trimalleolar fracture of right lower leg, initial encounter for closed fracture: Secondary | ICD-10-CM | POA: Diagnosis not present

## 2021-01-11 DIAGNOSIS — G8918 Other acute postprocedural pain: Secondary | ICD-10-CM | POA: Diagnosis not present

## 2021-01-11 DIAGNOSIS — S8291XA Unspecified fracture of right lower leg, initial encounter for closed fracture: Secondary | ICD-10-CM | POA: Diagnosis not present

## 2021-01-11 DIAGNOSIS — S8261XA Displaced fracture of lateral malleolus of right fibula, initial encounter for closed fracture: Secondary | ICD-10-CM | POA: Diagnosis not present

## 2021-01-11 DIAGNOSIS — S92321A Displaced fracture of second metatarsal bone, right foot, initial encounter for closed fracture: Secondary | ICD-10-CM | POA: Diagnosis not present

## 2021-01-11 DIAGNOSIS — R519 Headache, unspecified: Secondary | ICD-10-CM | POA: Diagnosis not present

## 2021-01-11 DIAGNOSIS — S82891A Other fracture of right lower leg, initial encounter for closed fracture: Secondary | ICD-10-CM | POA: Diagnosis not present

## 2021-01-12 DIAGNOSIS — W108XXA Fall (on) (from) other stairs and steps, initial encounter: Secondary | ICD-10-CM | POA: Diagnosis present

## 2021-01-12 DIAGNOSIS — S92321A Displaced fracture of second metatarsal bone, right foot, initial encounter for closed fracture: Secondary | ICD-10-CM | POA: Diagnosis present

## 2021-01-12 DIAGNOSIS — R55 Syncope and collapse: Secondary | ICD-10-CM | POA: Diagnosis present

## 2021-01-12 DIAGNOSIS — S93431A Sprain of tibiofibular ligament of right ankle, initial encounter: Secondary | ICD-10-CM | POA: Diagnosis present

## 2021-01-12 DIAGNOSIS — Z20822 Contact with and (suspected) exposure to covid-19: Secondary | ICD-10-CM | POA: Diagnosis present

## 2021-01-12 DIAGNOSIS — Y92008 Other place in unspecified non-institutional (private) residence as the place of occurrence of the external cause: Secondary | ICD-10-CM | POA: Diagnosis not present

## 2021-01-12 DIAGNOSIS — S8261XA Displaced fracture of lateral malleolus of right fibula, initial encounter for closed fracture: Secondary | ICD-10-CM | POA: Diagnosis present

## 2021-01-16 ENCOUNTER — Ambulatory Visit: Payer: Medicare Other

## 2021-01-16 ENCOUNTER — Telehealth: Payer: Self-pay

## 2021-01-16 NOTE — Telephone Encounter (Signed)
Pt called stating that the hospital in [place] would like for [place] to request the xrays from the pt. She documentation that they gave her at the hospital just not the xrays from before and after surgery.   Hospitals fax # is [phone]

## 2021-01-16 NOTE — Telephone Encounter (Signed)
Faxed request for records and imaging on disc to be mailed. Confirmation received.

## 2021-01-17 ENCOUNTER — Other Ambulatory Visit: Payer: Self-pay | Admitting: Surgical

## 2021-01-17 ENCOUNTER — Encounter: Payer: Self-pay | Admitting: Orthopedic Surgery

## 2021-01-17 ENCOUNTER — Ambulatory Visit (HOSPITAL_COMMUNITY)
Admission: RE | Admit: 2021-01-17 | Discharge: 2021-01-17 | Disposition: A | Discharge status: Home / Self Care | Payer: Medicare Other | Source: Ambulatory Visit | Attending: Surgical | Admitting: Surgical

## 2021-01-17 ENCOUNTER — Ambulatory Visit (INDEPENDENT_AMBULATORY_CARE_PROVIDER_SITE_OTHER): Payer: Medicare Other | Admitting: Orthopedic Surgery

## 2021-01-17 ENCOUNTER — Ambulatory Visit (INDEPENDENT_AMBULATORY_CARE_PROVIDER_SITE_OTHER): Payer: Medicare Other

## 2021-01-17 ENCOUNTER — Other Ambulatory Visit: Payer: Self-pay

## 2021-01-17 DIAGNOSIS — M25571 Pain in right ankle and joints of right foot: Secondary | ICD-10-CM

## 2021-01-17 MED ORDER — RIVAROXABAN (XARELTO) VTE STARTER PACK (15 & 20 MG): ORAL_TABLET | ORAL | 0 refills | Status: DC

## 2021-01-17 NOTE — Progress Notes (Signed)
Post-Op Visit Note   Patient: Jessica Lowery           Date of Birth: 1950-08-17           MRN: [id number] Visit Date: 01/17/2021 PCP: Mayra Neer, MD   Assessment & Plan:  Chief Complaint:  Chief Complaint  Patient presents with   Right Ankle - Injury   Visit Diagnoses:  1. Pain in right ankle and joints of right foot     Plan: Patient is a 70 year old female who presents s/p right ankle ORIF on 01/10/2021 at outside hospital in [place].  She fell while visiting family in [place] and had to have surgery to fix her ankle fracture.  She lives here but had surgery in [place].  She is currently nonweightbearing and had the splint removed.  She has been taking Lovenox since the procedure and traveled back to [place] on airplane on Tuesday.  She states that pain is controlled and she has been nonweightbearing.  Denies any fevers, chills, night sweats, drainage from the incision.  She does note some calf pain but denies any chest pain or shortness of breath.  On exam she has incision that is healing well with no evidence of infection or dehiscence.  No fluctuance around the incision or any expressible drainage.  Syndesmosis feels stable on exam.  She does have some calf tenderness and positive Homans' sign.  Radiographs of the right ankle taken today show hardware in good position with no displacement at the fracture site or disruption of the ankle mortise.  Plan to leave sutures in with 7-day return for suture removal and obtain ultrasound to rule out DVT this afternoon.  She has a boot at home from prior surgery to the contralateral extremity and will use this boot instead of being provided with 1 today.  She understands she will remain nonweightbearing.  Start ankle range of motion exercises.  Ultrasound was found to be positive for age-indeterminate DVT in the peroneal vein.  She was told to discontinue Lovenox and Xarelto starter pack was sent in for her which was preferable to continuing  Lovenox injections.  She will follow-up with Dr. Brigitte Pulse, her PCP for further management of the DVT and how long she will be on the blood thinner.  Follow-Up Instructions: Return in about 4 days (around 01/21/2021) for suture removal.   Orders:  Orders Placed This Encounter  Procedures   XR Ankle Complete Right   VAS [place] LOWER EXTREMITY VENOUS (DVT)   No orders of the defined types were placed in this encounter.   Imaging: VAS [place] LOWER EXTREMITY VENOUS (DVT)  Result Date: 01/17/2021  Lower Venous DVT Study Patient Name:  Jessica Lowery  Date of Exam:   01/17/2021 Medical Rec #: [id number]       Accession #:    [id number] Date of Birth: Jun 07, 1951       Patient Gender: F Patient Age:   70Y Exam Location:  [facility] Procedure:      VAS [place] LOWER EXTREMITY VENOUS (DVT) Referring Phys: [id number] CHARLES L MAGNANT --------------------------------------------------------------------------------  Indications: Pain, Swelling, and Post-op.  Comparison Study: None Performing Technologist: Ivan Croft  Examination Guidelines: A complete evaluation includes B-mode imaging, spectral Doppler, color Doppler, and power Doppler as needed of all accessible portions of each vessel. Bilateral testing is considered an integral part of a complete examination. Limited examinations for reoccurring indications may be performed as noted. The reflux portion of the exam is performed with  the patient in reverse Trendelenburg.  +---------+---------------+---------+-----------+----------+-------------------+ RIGHT    CompressibilityPhasicitySpontaneityPropertiesThrombus Aging      +---------+---------------+---------+-----------+----------+-------------------+ CFV      Full           Yes      Yes                                      +---------+---------------+---------+-----------+----------+-------------------+ SFJ      Full                                                              +---------+---------------+---------+-----------+----------+-------------------+ FV Prox  Full                                                             +---------+---------------+---------+-----------+----------+-------------------+ FV Mid   Full           Yes      Yes                                      +---------+---------------+---------+-----------+----------+-------------------+ FV DistalFull                                                             +---------+---------------+---------+-----------+----------+-------------------+ PFV      Full                                                             +---------+---------------+---------+-----------+----------+-------------------+ POP      Full           Yes      Yes                                      +---------+---------------+---------+-----------+----------+-------------------+ PTV      Full                                                             +---------+---------------+---------+-----------+----------+-------------------+ PERO     Partial                                      Age Indeterminate,  one of the two                                                            paired veins        +---------+---------------+---------+-----------+----------+-------------------+ GSV      Full                                                             +---------+---------------+---------+-----------+----------+-------------------+ SSV      Full                                                             +---------+---------------+---------+-----------+----------+-------------------+   +----+---------------+---------+-----------+----------+--------------+ LEFTCompressibilityPhasicitySpontaneityPropertiesThrombus Aging +----+---------------+---------+-----------+----------+--------------+ CFV Full           Yes       Yes                                 +----+---------------+---------+-----------+----------+--------------+     Summary: RIGHT: - Findings consistent with age indeterminate deep vein thrombosis involving one of the paired right peroneal veins. - There is no evidence of superficial venous thrombosis.  - No cystic structure found in the popliteal fossa.  LEFT: - No evidence of common femoral vein obstruction. - Results were given to [facility] (01/17/2021).  *See table(s) above for measurements and observations.    Preliminary     PMFS History: There are no problems to display for this patient.  No past medical history on file.  Family History  Problem Relation Age of Onset   Dementia Mother     Past Surgical History:  Procedure Laterality Date   FOOT SURGERY Left    Social History   Occupational History   Not on file  Tobacco Use   Smoking status: Former    Years: 24.00    Types: Cigarettes    Quit date: 06/30/1986    Years since quitting: 34.5   Smokeless tobacco: Never  Vaping Use   Vaping Use: Never used  Substance and Sexual Activity   Alcohol use: No   Drug use: No   Sexual activity: Not on file

## 2021-01-19 ENCOUNTER — Encounter: Payer: Self-pay | Admitting: Orthopedic Surgery

## 2021-01-21 ENCOUNTER — Ambulatory Visit: Payer: Medicare Other

## 2021-01-22 NOTE — Telephone Encounter (Signed)
Disc received-placed in your box.

## 2021-01-23 ENCOUNTER — Other Ambulatory Visit: Payer: Self-pay | Admitting: Surgical

## 2021-01-23 ENCOUNTER — Encounter: Payer: Self-pay | Admitting: Orthopedic Surgery

## 2021-01-23 MED ORDER — ENOXAPARIN SODIUM 60 MG/0.6ML IJ SOSY: 1.0000 mg/kg | PREFILLED_SYRINGE | Freq: Two times a day (BID) | INTRAMUSCULAR | 0 refills | Status: AC

## 2021-01-23 NOTE — Telephone Encounter (Signed)
Sent in '1mg'$ /kg q12h dose of lovenox for patient.  She denies any history of liver/kidney disease and gave me her most recent weight which is no less than one month old.  We will see her back tomorrow morening

## 2021-01-24 ENCOUNTER — Other Ambulatory Visit: Payer: Self-pay

## 2021-01-24 ENCOUNTER — Ambulatory Visit (INDEPENDENT_AMBULATORY_CARE_PROVIDER_SITE_OTHER): Payer: Medicare Other | Admitting: Orthopedic Surgery

## 2021-01-24 ENCOUNTER — Other Ambulatory Visit: Payer: Self-pay | Admitting: Surgical

## 2021-01-24 DIAGNOSIS — Z8781 Personal history of (healed) traumatic fracture: Secondary | ICD-10-CM | POA: Diagnosis not present

## 2021-01-25 ENCOUNTER — Encounter: Payer: Self-pay | Admitting: Orthopedic Surgery

## 2021-01-25 DIAGNOSIS — S82891D Other fracture of right lower leg, subsequent encounter for closed fracture with routine healing: Secondary | ICD-10-CM | POA: Diagnosis not present

## 2021-01-25 DIAGNOSIS — R319 Hematuria, unspecified: Secondary | ICD-10-CM | POA: Diagnosis not present

## 2021-01-25 DIAGNOSIS — Z7189 Other specified counseling: Secondary | ICD-10-CM | POA: Diagnosis not present

## 2021-01-25 DIAGNOSIS — I82451 Acute embolism and thrombosis of right peroneal vein: Secondary | ICD-10-CM | POA: Diagnosis not present

## 2021-01-25 NOTE — Progress Notes (Signed)
   Post-Op Visit Note   Patient: Jessica Lowery           Date of Birth: 07/22/1950           MRN: [id number] Visit Date: 01/24/2021 PCP: Mayra Neer, MD   Assessment & Plan:  Chief Complaint:  Chief Complaint  Patient presents with   Right Ankle - Follow-up   Visit Diagnoses: No diagnosis found.  Plan: Patient is a 70 year old female who presents s/p right ankle ORIF at outside facility on 01/10/2021.  She is doing well and pain is controlled.  Sutures are intact.  She is compliant with nonweightbearing restriction in the fracture boot.  She was diagnosed with DVT after her last visit and started on Xarelto starter pack.  She was previously on Lovenox prophylactic dose prior to diagnosis of DVT.  After starting the Xarelto several days ago, she immediately began to have bright red urine with every episode of urination.  Denies any history of kidney disease or any similar difficulty with hematuria.  She denies any history of cancer, chronic UTI, fever, chills, night sweats, difficulties voiding, burning with urination.  After being informed of her symptoms from the [facility], she was instructed to discontinue Xarelto and start weight-based dosing of Lovenox for treatment of DVT.  She will start this tonight/tomorrow as she is able to get the Lovenox.  Denies any chest pain, shortness of breath, heart history.   On exam she has mild calf tenderness of the right calf.  Incision is healing well with no drainage or any expressible drainage.  She has a little bit of surrounding erythema likely due to the sutures that have been in for about 2 weeks.  These sutures were removed without difficulty and replaced with Steri-Strips.  She is okay to shower.  Prescription given for shower chair.  Continue with nonweightbearing status and follow-up in 10 days for clinical recheck with new radiographs at the time.  Appointment will be arranged for her to follow-up with her PCP for evaluation of hematuria to rule  out anticoagulant related nephropathy  Follow-Up Instructions: No follow-ups on file.   Orders:  No orders of the defined types were placed in this encounter.  No orders of the defined types were placed in this encounter.   Imaging: No results found.  PMFS History: There are no problems to display for this patient.  No past medical history on file.  Family History  Problem Relation Age of Onset   Dementia Mother     Past Surgical History:  Procedure Laterality Date   FOOT SURGERY Left    Social History   Occupational History   Not on file  Tobacco Use   Smoking status: Former    Years: 24.00    Types: Cigarettes    Quit date: 06/30/1986    Years since quitting: 34.5   Smokeless tobacco: Never  Vaping Use   Vaping Use: Never used  Substance and Sexual Activity   Alcohol use: No   Drug use: No   Sexual activity: Not on file

## 2021-02-04 ENCOUNTER — Other Ambulatory Visit: Payer: Self-pay

## 2021-02-04 ENCOUNTER — Ambulatory Visit (INDEPENDENT_AMBULATORY_CARE_PROVIDER_SITE_OTHER): Payer: Medicare Other | Admitting: Orthopedic Surgery

## 2021-02-04 ENCOUNTER — Ambulatory Visit: Payer: Self-pay

## 2021-02-04 DIAGNOSIS — Z8781 Personal history of (healed) traumatic fracture: Secondary | ICD-10-CM | POA: Diagnosis not present

## 2021-02-11 ENCOUNTER — Encounter: Payer: Self-pay | Admitting: Orthopedic Surgery

## 2021-02-11 NOTE — Progress Notes (Signed)
   Office Visit Note   Patient: Jessica Lowery           Date of Birth: 1950/08/12           MRN: [id number] Visit Date: 02/04/2021 Requested by: Mayra Neer, MD [address] [place],  [place] [address] PCP: Mayra Neer, MD  Subjective: Chief Complaint  Patient presents with   Other    01/10/21 right ankle ORIF    HPI: Jessica Lowery is a 70 year old patient who underwent right ankle open reduction internal fixation 01/10/2021.  Patient has been doing well.  She did develop a DVT after surgery while she was on Lovenox.  She has minimal pain.  Taking Tylenol for pain.  She has been nonweightbearing in a fracture boot.              ROS: All systems reviewed are negative as they relate to the chief complaint within the history of present illness.  Patient denies  fevers or chills.   Assessment & Plan: Visit Diagnoses:  1. History of fracture of right ankle     Plan: Impression is DVT following ankle open after internal fixation.  She is 4 weeks out.  Radiographs look good today.  I want her to do some ankle range of motion exercises.  Syndesmosis is stable on exam.  3-week return.  We will have her do some partial weightbearing then for 2 weeks and then weightbearing as tolerated.  She is on Eliquis for her DVT.  Follow-Up Instructions: Return in about 3 weeks (around 02/25/2021).   Orders:  Orders Placed This Encounter  Procedures   XR Ankle Complete Right   No orders of the defined types were placed in this encounter.     Procedures: No procedures performed   Clinical Data: No additional findings.  Objective: Vital Signs: There were no vitals taken for this visit.  Physical Exam:   Constitutional: Patient appears well-developed HEENT:  Head: Normocephalic Eyes:EOM are normal Neck: Normal range of motion Cardiovascular: Normal rate Pulmonary/chest: Effort normal Neurologic: Patient is alert Skin: Skin is warm Psychiatric: Patient has normal mood  and affect   Ortho Exam: Ortho exam demonstrates no tenderness around the incisions.  No calf tenderness is present.  Ankle dorsiflexion is about 10 degrees past neutral.  Plantarflexion intact.  Strength is improving.  Syndesmosis feel stable to exam.  Specialty Comments:  No specialty comments available.  Imaging: No results found.   PMFS History: There are no problems to display for this patient.  History reviewed. No pertinent past medical history.  Family History  Problem Relation Age of Onset   Dementia Mother     Past Surgical History:  Procedure Laterality Date   FOOT SURGERY Left    Social History   Occupational History   Not on file  Tobacco Use   Smoking status: Former    Years: 24.00    Types: Cigarettes    Quit date: 06/30/1986    Years since quitting: 34.6   Smokeless tobacco: Never  Vaping Use   Vaping Use: Never used  Substance and Sexual Activity   Alcohol use: No   Drug use: No   Sexual activity: Not on file

## 2021-02-21 DIAGNOSIS — I82451 Acute embolism and thrombosis of right peroneal vein: Secondary | ICD-10-CM | POA: Diagnosis not present

## 2021-02-21 DIAGNOSIS — S82891D Other fracture of right lower leg, subsequent encounter for closed fracture with routine healing: Secondary | ICD-10-CM | POA: Diagnosis not present

## 2021-02-25 ENCOUNTER — Ambulatory Visit (INDEPENDENT_AMBULATORY_CARE_PROVIDER_SITE_OTHER): Payer: Medicare Other | Admitting: Orthopedic Surgery

## 2021-02-25 ENCOUNTER — Other Ambulatory Visit: Payer: Self-pay

## 2021-02-25 ENCOUNTER — Ambulatory Visit (INDEPENDENT_AMBULATORY_CARE_PROVIDER_SITE_OTHER): Payer: Medicare Other

## 2021-02-25 ENCOUNTER — Encounter: Payer: Self-pay | Admitting: Orthopedic Surgery

## 2021-02-25 DIAGNOSIS — Z8781 Personal history of (healed) traumatic fracture: Secondary | ICD-10-CM | POA: Diagnosis not present

## 2021-02-25 NOTE — Progress Notes (Signed)
   Post-Op Visit Note   Patient: Jessica Lowery           Date of Birth: 02/17/1951           MRN: [id number] Visit Date: 02/25/2021 PCP: Mayra Neer, MD   Assessment & Plan:  Chief Complaint:  Chief Complaint  Patient presents with   Right Ankle - Routine Post Op   Visit Diagnoses:  1. History of fracture of right ankle     Plan: Juliett is a 70 year old patient is 6 weeks out right ankle open duction internal fixation 01/10/2021.  She had a slip off of her scooter today.  On exam she has stable syndesmosis with intact incision.  Radiographs look good.  Plan at this time is knee scooter nonweightbearing for 2 weeks followed by weightbearing as tolerated in fracture boot for 2 weeks.  Come back in 4 weeks for clinical recheck and radiographs.  Would likely release her at that time if everything looks stable.  No calf tenderness negative Homans today.  Follow-Up Instructions: Return in about 4 weeks (around 03/25/2021).   Orders:  Orders Placed This Encounter  Procedures   XR Ankle Complete Right   No orders of the defined types were placed in this encounter.   Imaging: XR Ankle Complete Right  Result Date: 02/25/2021 AP lateral mortise radiographs right ankle reviewed.  Plate and screw fixation of lateral malleolar fracture and syndesmotic injury in good position alignment.  Mortise is symmetric.  No new fracture.  No lucency around the syndesmotic screw   PMFS History: There are no problems to display for this patient.  No past medical history on file.  Family History  Problem Relation Age of Onset   Dementia Mother     Past Surgical History:  Procedure Laterality Date   FOOT SURGERY Left    Social History   Occupational History   Not on file  Tobacco Use   Smoking status: Former    Years: 24.00    Types: Cigarettes    Quit date: 06/30/1986    Years since quitting: 34.6   Smokeless tobacco: Never  Vaping Use   Vaping Use: Never used  Substance and Sexual  Activity   Alcohol use: No   Drug use: No   Sexual activity: Not on file

## 2021-03-19 DIAGNOSIS — R319 Hematuria, unspecified: Secondary | ICD-10-CM | POA: Diagnosis not present

## 2021-03-21 ENCOUNTER — Ambulatory Visit
Admission: RE | Admit: 2021-03-21 | Discharge: 2021-03-21 | Disposition: A | Discharge status: Home / Self Care | Payer: Medicare Other | Source: Ambulatory Visit | Attending: Family Medicine | Admitting: Family Medicine

## 2021-03-21 ENCOUNTER — Other Ambulatory Visit: Payer: Self-pay

## 2021-03-21 DIAGNOSIS — Z1231 Encounter for screening mammogram for malignant neoplasm of breast: Secondary | ICD-10-CM | POA: Diagnosis not present

## 2021-03-25 ENCOUNTER — Other Ambulatory Visit: Payer: Self-pay

## 2021-03-25 ENCOUNTER — Ambulatory Visit (INDEPENDENT_AMBULATORY_CARE_PROVIDER_SITE_OTHER): Payer: Medicare Other | Admitting: Orthopedic Surgery

## 2021-03-25 ENCOUNTER — Ambulatory Visit: Payer: Self-pay

## 2021-03-25 DIAGNOSIS — Z8781 Personal history of (healed) traumatic fracture: Secondary | ICD-10-CM

## 2021-03-25 DIAGNOSIS — M25562 Pain in left knee: Secondary | ICD-10-CM

## 2021-03-25 DIAGNOSIS — M659 Synovitis and tenosynovitis, unspecified: Secondary | ICD-10-CM

## 2021-03-25 DIAGNOSIS — M25571 Pain in right ankle and joints of right foot: Secondary | ICD-10-CM | POA: Diagnosis not present

## 2021-03-26 ENCOUNTER — Encounter: Payer: Self-pay | Admitting: Orthopedic Surgery

## 2021-03-26 NOTE — Progress Notes (Signed)
Procedure Note  Patient: Jessica Lowery             Date of Birth: 02/07/1951           MRN: [id number]             Visit Date: 03/25/2021  Procedures: Visit Diagnoses:  1. History of fracture of right ankle     Large Joint Inj: L knee on 03/25/2021 12:14 AM Indications: diagnostic evaluation, joint swelling and pain Details: 18 G 1.5 in needle, superolateral approach  Arthrogram: No  Medications: 5 mL lidocaine 1 %; 40 mg methylPREDNISolone acetate 40 MG/ML; 4 mL bupivacaine 0.25 % Outcome: tolerated well, no immediate complications Procedure, treatment alternatives, risks and benefits explained, specific risks discussed. Consent was given by the patient. Immediately prior to procedure a time out was called to verify the correct patient, procedure, equipment, support staff and site/side marked as required. Patient was prepped and draped in the usual sterile fashion.        Post-Op Visit Note   Patient: Jessica Lowery           Date of Birth: 10/07/1950           MRN: [id number] Visit Date: 03/25/2021 PCP: Mayra Neer, MD   Assessment & Plan:  Chief Complaint:  Chief Complaint  Patient presents with   Right Ankle - Routine Post Op    right ankle open duction internal fixation 01/10/2021   Visit Diagnoses:  1. History of fracture of right ankle     Plan: Patient is a 70 year old female who presents s/p right ankle ORIF on 01/10/2021 at outside facility.  Postoperative course has been complicated by DVT for which she is on Eliquis.  She states the right ankle is doing well following her fall off of her knee scooter about a month ago.  She has been weightbearing as tolerated with fracture boot over the last 2 weeks.  Plan to transition from weightbearing as tolerated in fracture boot to weightbearing as tolerated with regular shoe.  On exam she has 2+ DP pulse with intact dorsiflexion, plantarflexion, inversion, eversion.  Tenderness over the medial lateral malleoli that  she rates about 4/10 on exam.  No calf tenderness.  Negative Homans' sign.  Patient denies any chest pain, shortness of breath, new/increased calf pain.  Her main complaint today is that her left knee is causing her discomfort.  She has a history of left knee osteoarthritis for which she has had injections before in the past.  Denies any mechanical symptoms but does feel like the knee wants to give way on her when she is trying to walk with her walker.  She would like to try left knee injection which has helped in the past.  Left knee was aspirated and injected with cortisone.  She tolerated the procedure well.  Plan for her to follow-up in 3 weeks for clinical recheck with new radiographs at the time.  Radiographs of the right ankle taken today show excellent fracture consolidation with no change in position of the hardware relative to previous radiographs.  Follow-Up Instructions: No follow-ups on file.   Orders:  Orders Placed This Encounter  Procedures   XR Ankle Complete Right   No orders of the defined types were placed in this encounter.   Imaging: No results found.  PMFS History: There are no problems to display for this patient.  No past medical history on file.  Family History  Problem Relation Age of Onset  Dementia Mother     Past Surgical History:  Procedure Laterality Date   FOOT SURGERY Left    Social History   Occupational History   Not on file  Tobacco Use   Smoking status: Former    Years: 24.00    Types: Cigarettes    Quit date: 06/30/1986    Years since quitting: 34.7   Smokeless tobacco: Never  Vaping Use   Vaping Use: Never used  Substance and Sexual Activity   Alcohol use: No   Drug use: No   Sexual activity: Not on file

## 2021-03-27 DIAGNOSIS — I82409 Acute embolism and thrombosis of unspecified deep veins of unspecified lower extremity: Secondary | ICD-10-CM | POA: Diagnosis not present

## 2021-03-27 DIAGNOSIS — Z0000 Encounter for general adult medical examination without abnormal findings: Secondary | ICD-10-CM | POA: Diagnosis not present

## 2021-03-27 DIAGNOSIS — R319 Hematuria, unspecified: Secondary | ICD-10-CM | POA: Diagnosis not present

## 2021-03-27 DIAGNOSIS — E7800 Pure hypercholesterolemia, unspecified: Secondary | ICD-10-CM | POA: Diagnosis not present

## 2021-03-27 DIAGNOSIS — D573 Sickle-cell trait: Secondary | ICD-10-CM | POA: Diagnosis not present

## 2021-03-27 DIAGNOSIS — M899 Disorder of bone, unspecified: Secondary | ICD-10-CM | POA: Diagnosis not present

## 2021-03-27 DIAGNOSIS — E041 Nontoxic single thyroid nodule: Secondary | ICD-10-CM | POA: Diagnosis not present

## 2021-03-27 DIAGNOSIS — F322 Major depressive disorder, single episode, severe without psychotic features: Secondary | ICD-10-CM | POA: Diagnosis not present

## 2021-04-02 MED ORDER — LIDOCAINE HCL 1 % IJ SOLN
5.0000 mL | INTRAMUSCULAR | Status: AC | PRN
  Administered 2021-03-25: 5 mL

## 2021-04-02 MED ORDER — METHYLPREDNISOLONE ACETATE 40 MG/ML IJ SUSP
40.0000 mg | INTRAMUSCULAR | Status: AC | PRN
  Administered 2021-03-25: 40 mg via INTRA_ARTICULAR

## 2021-04-02 MED ORDER — BUPIVACAINE HCL 0.25 % IJ SOLN
4.0000 mL | INTRAMUSCULAR | Status: AC | PRN
  Administered 2021-03-25: 4 mL via INTRA_ARTICULAR

## 2021-04-15 ENCOUNTER — Encounter: Payer: Self-pay | Admitting: Orthopedic Surgery

## 2021-04-15 ENCOUNTER — Ambulatory Visit: Payer: Self-pay

## 2021-04-15 ENCOUNTER — Other Ambulatory Visit: Payer: Self-pay

## 2021-04-15 ENCOUNTER — Ambulatory Visit (INDEPENDENT_AMBULATORY_CARE_PROVIDER_SITE_OTHER): Payer: Medicare Other | Admitting: Orthopedic Surgery

## 2021-04-15 DIAGNOSIS — M79672 Pain in left foot: Secondary | ICD-10-CM

## 2021-04-15 DIAGNOSIS — M25571 Pain in right ankle and joints of right foot: Secondary | ICD-10-CM | POA: Diagnosis not present

## 2021-04-21 ENCOUNTER — Encounter: Payer: Self-pay | Admitting: Orthopedic Surgery

## 2021-04-21 NOTE — Progress Notes (Signed)
   Office Visit Note   Patient: Jessica Lowery           Date of Birth: 22-Mar-1951           MRN: [id number] Visit Date: 04/15/2021 Requested by: Mayra Neer, MD [address] [place],  [place] [address] PCP: Mayra Neer, MD  Subjective: Chief Complaint  Patient presents with   Right Ankle - Follow-up    HPI: Jessica Lowery is a patient with right ankle pain.  She underwent right ankle open reduction internal fixation 01/10/2021.  Had left knee injection 03/25/2021.  The knee injection did not really help.  She does have medial compartment arthritis from radiographs.  Her ankle is doing well.  She is ambulating with a cane.  She is doing her own exercises.              ROS: All systems reviewed are negative as they relate to the chief complaint within the history of present illness.  Patient denies  fevers or chills.   Assessment & Plan: Visit Diagnoses:  1. Pain in right ankle and joints of right foot     Plan: Impression is well healing right ankle fracture with stable syndesmosis.  No hardware complications.  Plan is January return for final check.  Continue weightbearing as tolerated with walker progressing to a cane as possible.  Anticipate near full recovery from the perspective of the ankle.  The left knee may require further injection treatment.  Follow-Up Instructions: No follow-ups on file.   Orders:  Orders Placed This Encounter  Procedures   XR Ankle Complete Right   No orders of the defined types were placed in this encounter.     Procedures: No procedures performed   Clinical Data: No additional findings.  Objective: Vital Signs: There were no vitals taken for this visit.  Physical Exam:   Constitutional: Patient appears well-developed HEENT:  Head: Normocephalic Eyes:EOM are normal Neck: Normal range of motion Cardiovascular: Normal rate Pulmonary/chest: Effort normal Neurologic: Patient is alert Skin: Skin is warm Psychiatric:  Patient has normal mood and affect   Ortho Exam: Ortho exam demonstrates good ankle dorsiflexion plantarflexion strength bilaterally with stable syndesmosis right versus left.  All incisions well-healed.  Slight amount of ankle swelling present which is not at the ordinary considering the time elapsed from her surgery.  No calf tenderness negative Homans today on the right-hand side  Specialty Comments:  No specialty comments available.  Imaging: No results found.   PMFS History: There are no problems to display for this patient.  History reviewed. No pertinent past medical history.  Family History  Problem Relation Age of Onset   Dementia Mother     Past Surgical History:  Procedure Laterality Date   FOOT SURGERY Left    Social History   Occupational History   Not on file  Tobacco Use   Smoking status: Former    Years: 24.00    Types: Cigarettes    Quit date: 06/30/1986    Years since quitting: 34.8   Smokeless tobacco: Never  Vaping Use   Vaping Use: Never used  Substance and Sexual Activity   Alcohol use: No   Drug use: No   Sexual activity: Not on file

## 2021-05-08 DIAGNOSIS — R310 Gross hematuria: Secondary | ICD-10-CM | POA: Diagnosis not present

## 2021-05-08 DIAGNOSIS — Z87442 Personal history of urinary calculi: Secondary | ICD-10-CM | POA: Diagnosis not present

## 2021-05-15 DIAGNOSIS — I700 Atherosclerosis of aorta: Secondary | ICD-10-CM | POA: Diagnosis not present

## 2021-05-15 DIAGNOSIS — R310 Gross hematuria: Secondary | ICD-10-CM | POA: Diagnosis not present

## 2021-05-15 DIAGNOSIS — N281 Cyst of kidney, acquired: Secondary | ICD-10-CM | POA: Diagnosis not present

## 2021-05-15 DIAGNOSIS — N200 Calculus of kidney: Secondary | ICD-10-CM | POA: Diagnosis not present

## 2021-06-07 DIAGNOSIS — H5213 Myopia, bilateral: Secondary | ICD-10-CM | POA: Diagnosis not present

## 2021-06-07 DIAGNOSIS — H2513 Age-related nuclear cataract, bilateral: Secondary | ICD-10-CM | POA: Diagnosis not present

## 2021-06-07 DIAGNOSIS — H40013 Open angle with borderline findings, low risk, bilateral: Secondary | ICD-10-CM | POA: Diagnosis not present

## 2021-06-25 DIAGNOSIS — N200 Calculus of kidney: Secondary | ICD-10-CM | POA: Diagnosis not present

## 2021-06-25 DIAGNOSIS — R310 Gross hematuria: Secondary | ICD-10-CM | POA: Diagnosis not present

## 2021-07-08 ENCOUNTER — Ambulatory Visit: Payer: Medicare Other | Admitting: Orthopedic Surgery

## 2021-07-24 ENCOUNTER — Ambulatory Visit (INDEPENDENT_AMBULATORY_CARE_PROVIDER_SITE_OTHER): Payer: Medicare Other

## 2021-07-24 ENCOUNTER — Other Ambulatory Visit: Payer: Self-pay

## 2021-07-24 ENCOUNTER — Ambulatory Visit (INDEPENDENT_AMBULATORY_CARE_PROVIDER_SITE_OTHER): Payer: Medicare Other | Admitting: Orthopedic Surgery

## 2021-07-24 ENCOUNTER — Encounter: Payer: Self-pay | Admitting: Orthopedic Surgery

## 2021-07-24 DIAGNOSIS — M25571 Pain in right ankle and joints of right foot: Secondary | ICD-10-CM

## 2021-07-24 NOTE — Progress Notes (Signed)
Office Visit Note   Patient: Jessica Lowery           Date of Birth: 1950/10/11           MRN: [id number] Visit Date: 07/24/2021 Requested by: Mayra Neer, MD [address] [place],  [place] [address] PCP: Mayra Neer, MD  Subjective: Chief Complaint  Patient presents with   Left Knee - Follow-up   Right Ankle - Follow-up    HPI: Jessica Lowery is a 71 year old patient here to follow-up left knee injection.  That was done 03/25/2021 and did not get much relief from that.  She also underwent right ankle open duction internal fixation 01/10/2021.  She reports some swelling with increased activity.  She uses a cane when she is out and around.  The knee hurts a little bit more than the ankle at this time.  Radiographs from 2021 are reviewed and she does have end-stage arthritis in that left knee primarily in the medial compartment.  She reports ankle soreness and swelling after prolonged standing.              ROS: All systems reviewed are negative as they relate to the chief complaint within the history of present illness.  Patient denies  fevers or chills.   Assessment & Plan: Visit Diagnoses:  1. Pain in right ankle and joints of right foot     Plan: Impression is right ankle pain with some swelling in the ankle joint which is not unexpected at this time.  Her ankle range of motion is actually good.  The difference she has in a side to side exam is she has more laxity with syndesmotic testing on the left compared to the right.  I think is possible that the syndesmotic screw may be constraining that ankle joint to a small degree.  Would consider removal of that screw if she finds the pain to be unlivable.  I am not certain that that would help the situation with the ankle but it is really the only thing intervention wise that I think has a reasonable chance of improving her situation.  Regarding the left knee she does have pain and she is using a cane.  She wants to hold off on  an injection for now.  She may need knee replacement but it is tolerable right now in terms of the symptoms.  She will follow-up with [place] in 1 year for clinical recheck  Follow-Up Instructions: Return in about 1 year (around 07/24/2022).   Orders:  Orders Placed This Encounter  Procedures   XR Ankle Complete Right   No orders of the defined types were placed in this encounter.     Procedures: No procedures performed   Clinical Data: No additional findings.  Objective: Vital Signs: There were no vitals taken for this visit.  Physical Exam:   Constitutional: Patient appears well-developed HEENT:  Head: Normocephalic Eyes:EOM are normal Neck: Normal range of motion Cardiovascular: Normal rate Pulmonary/chest: Effort normal Neurologic: Patient is alert Skin: Skin is warm Psychiatric: Patient has normal mood and affect   Ortho Exam: Ortho exam demonstrates slight varus alignment left lower extremity.  Extensor mechanism intact.  She has good range of motion with extension to about 115 of flexion.  Collateral and cruciate ligaments are stable.  Pedal pulse palpable.  Right ankle has slightly more medial lateral constraint when testing the syndesmosis on the right versus left.  Ankle dorsiflexion is about 10 degrees past neutral on the  right and 20 degrees past neutral with plantar flexion.  Incisions well-healed.  Mild swelling is present right ankle compared to the left.  There is some medial malleolar tenderness.  Specialty Comments:  No specialty comments available.  Imaging: XR Ankle Complete Right  Result Date: 07/24/2021 AP lateral mortise radiographs right ankle reviewed.  Lateral malleolar plate fixation with single 3 cortices syndesmotic screw in good position alignment with no complicating features.  Ankle mortise is symmetric.  No significant arthritis is present.  No widening of the medial clear space.    PMFS History: There are no problems to display for this  patient.  History reviewed. No pertinent past medical history.  Family History  Problem Relation Age of Onset   Dementia Mother     Past Surgical History:  Procedure Laterality Date   FOOT SURGERY Left    Social History   Occupational History   Not on file  Tobacco Use   Smoking status: Former    Years: 24.00    Types: Cigarettes    Quit date: 06/30/1986    Years since quitting: 35.0   Smokeless tobacco: Never  Vaping Use   Vaping Use: Never used  Substance and Sexual Activity   Alcohol use: No   Drug use: No   Sexual activity: Not on file

## 2021-12-03 ENCOUNTER — Other Ambulatory Visit: Payer: Self-pay | Admitting: Physician Assistant

## 2021-12-03 ENCOUNTER — Ambulatory Visit
Admission: RE | Admit: 2021-12-03 | Discharge: 2021-12-03 | Disposition: A | Discharge status: Home / Self Care | Payer: Medicare Other | Source: Ambulatory Visit | Attending: Physician Assistant | Admitting: Physician Assistant

## 2021-12-03 DIAGNOSIS — R197 Diarrhea, unspecified: Secondary | ICD-10-CM | POA: Diagnosis not present

## 2021-12-03 DIAGNOSIS — R194 Change in bowel habit: Secondary | ICD-10-CM

## 2021-12-03 DIAGNOSIS — R109 Unspecified abdominal pain: Secondary | ICD-10-CM

## 2021-12-03 DIAGNOSIS — N200 Calculus of kidney: Secondary | ICD-10-CM | POA: Diagnosis not present

## 2021-12-03 DIAGNOSIS — I878 Other specified disorders of veins: Secondary | ICD-10-CM | POA: Diagnosis not present

## 2022-02-03 NOTE — Progress Notes (Signed)
Hi Lauren why am I getting this?

## 2022-02-04 NOTE — Progress Notes (Signed)
Patient just wanted it to be in her record from her fall 2022

## 2022-02-05 DIAGNOSIS — J3489 Other specified disorders of nose and nasal sinuses: Secondary | ICD-10-CM | POA: Diagnosis not present

## 2022-02-05 DIAGNOSIS — R519 Headache, unspecified: Secondary | ICD-10-CM | POA: Diagnosis not present

## 2022-02-07 ENCOUNTER — Other Ambulatory Visit: Payer: Self-pay | Admitting: Family Medicine

## 2022-02-07 DIAGNOSIS — Z1231 Encounter for screening mammogram for malignant neoplasm of breast: Secondary | ICD-10-CM

## 2022-02-28 ENCOUNTER — Ambulatory Visit
Admission: RE | Admit: 2022-02-28 | Discharge: 2022-02-28 | Disposition: A | Discharge status: Home / Self Care | Payer: Medicare Other | Source: Ambulatory Visit | Attending: Family Medicine | Admitting: Family Medicine

## 2022-02-28 DIAGNOSIS — Z1231 Encounter for screening mammogram for malignant neoplasm of breast: Secondary | ICD-10-CM

## 2022-03-28 ENCOUNTER — Ambulatory Visit
Admission: RE | Admit: 2022-03-28 | Discharge: 2022-03-28 | Disposition: A | Discharge status: Home / Self Care | Payer: Medicare Other | Source: Ambulatory Visit | Attending: Family Medicine | Admitting: Family Medicine

## 2022-03-28 DIAGNOSIS — Z1231 Encounter for screening mammogram for malignant neoplasm of breast: Secondary | ICD-10-CM | POA: Diagnosis not present

## 2022-04-04 ENCOUNTER — Ambulatory Visit (INDEPENDENT_AMBULATORY_CARE_PROVIDER_SITE_OTHER): Payer: Medicare Other

## 2022-04-04 ENCOUNTER — Ambulatory Visit (INDEPENDENT_AMBULATORY_CARE_PROVIDER_SITE_OTHER): Payer: Medicare Other | Admitting: Orthopedic Surgery

## 2022-04-04 ENCOUNTER — Encounter: Payer: Self-pay | Admitting: Orthopedic Surgery

## 2022-04-04 ENCOUNTER — Telehealth: Payer: Self-pay

## 2022-04-04 DIAGNOSIS — Z8781 Personal history of (healed) traumatic fracture: Secondary | ICD-10-CM

## 2022-04-04 DIAGNOSIS — M1712 Unilateral primary osteoarthritis, left knee: Secondary | ICD-10-CM | POA: Diagnosis not present

## 2022-04-04 DIAGNOSIS — M25562 Pain in left knee: Secondary | ICD-10-CM | POA: Diagnosis not present

## 2022-04-04 MED ORDER — METHYLPREDNISOLONE ACETATE 40 MG/ML IJ SUSP
40.0000 mg | INTRAMUSCULAR | Status: AC | PRN
  Administered 2022-04-04: 40 mg via INTRA_ARTICULAR

## 2022-04-04 MED ORDER — LIDOCAINE HCL 1 % IJ SOLN
5.0000 mL | INTRAMUSCULAR | Status: AC | PRN
  Administered 2022-04-04: 5 mL

## 2022-04-04 MED ORDER — BUPIVACAINE HCL 0.25 % IJ SOLN
4.0000 mL | INTRAMUSCULAR | Status: AC | PRN
  Administered 2022-04-04: 4 mL via INTRA_ARTICULAR

## 2022-04-04 NOTE — Progress Notes (Signed)
Office Visit Note   Patient: Jessica Lowery           Date of Birth: [birth year]           MRN: [id number] Visit Date: 04/04/2022 Requested by: Mayra Neer, MD [address] [place],  [place] [address] PCP: Mayra Neer, MD  Subjective: Chief Complaint  Patient presents with   Left Knee - Pain   Right Ankle - Pain    HPI: Jessica Lowery is a 71 y.o. female who presents to the office reporting left knee worsening pain.  She states her knee is weak and gives way.  Reports catching.  Decision point today was for or against injection.  She also describes right foot and ankle pain with continued pain following right ankle surgery 01/10/2021.  She is using a cane for ambulation.  Localizes most of her pain in the midfoot region.  Denies much in the way of swelling in the left knee or right ankle.  She states she has a known history of pes planus.              ROS: All systems reviewed are negative as they relate to the chief complaint within the history of present illness.  Patient denies fevers or chills.  Assessment & Plan: Visit Diagnoses:  1. History of fracture of right ankle   2. Left knee pain, unspecified chronicity     Plan: Impression is left knee arthritis which appears to be worsening some.  Injection performed today and we will preapproved her for gel injection in that left knee when this cortisone injection wears off.  The right foot and ankle problem appears to be tarsometatarsal arthritis.  She does have pes planus which could be aggravating the tarsometatarsal arthritis.  Recommended that she try some inserts from local vendor to see if they can help with this midfoot problem.  The ankle itself looks stable and I do not think that she is having much pain from the retained syndesmotic screw.  Follow-up likely within a month or 2 to try gel injection in the knee. This patient is diagnosed with osteoarthritis of the knee(s).    Radiographs show evidence of  joint space narrowing, osteophytes, subchondral sclerosis and/or subchondral cysts.  This patient has knee pain which interferes with functional and activities of daily living.    This patient has experienced inadequate response, adverse effects and/or intolerance with conservative treatments such as acetaminophen, NSAIDS, topical creams, physical therapy or regular exercise, knee bracing and/or weight loss.   This patient has experienced inadequate response or has a contraindication to intra articular steroid injections for at least 3 months.   This patient is not scheduled to have a total knee replacement within 6 months of starting treatment with viscosupplementation.   Follow-Up Instructions: No follow-ups on file.   Orders:  Orders Placed This Encounter  Procedures   XR Foot Complete Right   XR Ankle Complete Right   XR KNEE 3 VIEW LEFT   No orders of the defined types were placed in this encounter.     Procedures: Large Joint Inj: L knee on 04/04/2022 10:22 PM Indications: diagnostic evaluation, joint swelling and pain Details: 18 G 1.5 in needle, superolateral approach  Arthrogram: No  Medications: 5 mL lidocaine 1 %; 40 mg methylPREDNISolone acetate 40 MG/ML; 4 mL bupivacaine 0.25 % Outcome: tolerated well, no immediate complications Procedure, treatment alternatives, risks and benefits explained, specific risks discussed. Consent was given by the  patient. Immediately prior to procedure a time out was called to verify the correct patient, procedure, equipment, support staff and site/side marked as required. Patient was prepped and draped in the usual sterile fashion.       Clinical Data: No additional findings.  Objective: Vital Signs: There were no vitals taken for this visit.  Physical Exam:  Constitutional: Patient appears well-developed HEENT:  Head: Normocephalic Eyes:EOM are normal Neck: Normal range of motion Cardiovascular: Normal rate Pulmonary/chest:  Effort normal Neurologic: Patient is alert Skin: Skin is warm Psychiatric: Patient has normal mood and affect  Ortho Exam: Ortho exam demonstrates normal gait and alignment.  Right ankle has excellent range of motion with good stability of the syndesmosis.  Dorsiflexion at 15 degrees past neutral plantarflexion 20-25 past neutral.  She has symmetric subtalar transverse tarsal motion.  No tenderness to palpation of the anterior to posterior to peroneal or Achilles tendons.  Heel pulses intact and sensation is intact on the dorsal and plantar aspect of the foot.  Left knee demonstrates excellent range of motion with no effusion.  Medial joint line tenderness is present.  Collateral and cruciate ligaments are stable.  No other masses lymphadenopathy or skin changes noted in that left knee region  Specialty Comments:  No specialty comments available.  Imaging: No results found.   PMFS History: There are no problems to display for this patient.  No past medical history on file.  Family History  Problem Relation Age of Onset   Dementia Mother     Past Surgical History:  Procedure Laterality Date   FOOT SURGERY Left    Social History   Occupational History   Not on file  Tobacco Use   Smoking status: Former    Years: 24.00    Types: Cigarettes    Quit date: 06/30/1986    Years since quitting: 35.7   Smokeless tobacco: Never  Vaping Use   Vaping Use: Never used  Substance and Sexual Activity   Alcohol use: No   Drug use: No   Sexual activity: Not on file

## 2022-04-04 NOTE — Telephone Encounter (Signed)
Auth needed for left knee gel injection  

## 2022-04-08 NOTE — Telephone Encounter (Signed)
VOB submitted for SynviscOne, left knee  

## 2022-04-10 DIAGNOSIS — D573 Sickle-cell trait: Secondary | ICD-10-CM | POA: Diagnosis not present

## 2022-04-10 DIAGNOSIS — Z0000 Encounter for general adult medical examination without abnormal findings: Secondary | ICD-10-CM | POA: Diagnosis not present

## 2022-04-10 DIAGNOSIS — M899 Disorder of bone, unspecified: Secondary | ICD-10-CM | POA: Diagnosis not present

## 2022-04-10 DIAGNOSIS — E041 Nontoxic single thyroid nodule: Secondary | ICD-10-CM | POA: Diagnosis not present

## 2022-04-10 DIAGNOSIS — E7800 Pure hypercholesterolemia, unspecified: Secondary | ICD-10-CM | POA: Diagnosis not present

## 2022-04-17 ENCOUNTER — Telehealth: Payer: Self-pay

## 2022-04-17 NOTE — Telephone Encounter (Signed)
PA submitted online through [employer] for SynviscOne, left knee Pending # [id number]

## 2022-04-18 ENCOUNTER — Other Ambulatory Visit: Payer: Self-pay

## 2022-04-18 DIAGNOSIS — M1712 Unilateral primary osteoarthritis, left knee: Secondary | ICD-10-CM

## 2022-04-25 ENCOUNTER — Ambulatory Visit (INDEPENDENT_AMBULATORY_CARE_PROVIDER_SITE_OTHER): Payer: Medicare Other | Admitting: Surgical

## 2022-04-25 DIAGNOSIS — M1712 Unilateral primary osteoarthritis, left knee: Secondary | ICD-10-CM

## 2022-04-27 ENCOUNTER — Encounter: Payer: Self-pay | Admitting: Surgical

## 2022-04-27 MED ORDER — HYLAN G-F 20 48 MG/6ML IX SOSY
48.0000 mg | PREFILLED_SYRINGE | INTRA_ARTICULAR | Status: AC | PRN
  Administered 2022-04-25: 48 mg via INTRA_ARTICULAR

## 2022-04-27 MED ORDER — LIDOCAINE HCL 1 % IJ SOLN
5.0000 mL | INTRAMUSCULAR | Status: AC | PRN
  Administered 2022-04-25: 5 mL

## 2022-04-27 NOTE — Progress Notes (Signed)
   Procedure Note  Patient: Jessica Lowery             Date of Birth: Aug 08, 1950           MRN: [id number]             Visit Date: 04/25/2022  Procedures: Visit Diagnoses:  1. Arthritis of left knee     Large Joint Inj: L knee on 04/25/2022 1:26 PM Indications: pain, joint swelling and diagnostic evaluation Details: 18 G 1.5 in needle, superolateral approach  Arthrogram: No  Medications: 5 mL lidocaine 1 %; 48 mg Hylan G-F 20 48 MG/6ML Aspirate: 8 mL Outcome: tolerated well, no immediate complications Procedure, treatment alternatives, risks and benefits explained, specific risks discussed. Consent was given by the patient. Immediately prior to procedure a time out was called to verify the correct patient, procedure, equipment, support staff and site/side marked as required. Patient was prepped and draped in the usual sterile fashion.

## 2022-06-17 DIAGNOSIS — H35412 Lattice degeneration of retina, left eye: Secondary | ICD-10-CM | POA: Diagnosis not present

## 2022-06-17 DIAGNOSIS — H5213 Myopia, bilateral: Secondary | ICD-10-CM | POA: Diagnosis not present

## 2022-06-17 DIAGNOSIS — H2513 Age-related nuclear cataract, bilateral: Secondary | ICD-10-CM | POA: Diagnosis not present

## 2022-06-26 DIAGNOSIS — H269 Unspecified cataract: Secondary | ICD-10-CM | POA: Diagnosis not present

## 2022-06-26 DIAGNOSIS — H2512 Age-related nuclear cataract, left eye: Secondary | ICD-10-CM | POA: Diagnosis not present

## 2022-07-10 DIAGNOSIS — H2512 Age-related nuclear cataract, left eye: Secondary | ICD-10-CM | POA: Diagnosis not present

## 2022-07-10 DIAGNOSIS — H2511 Age-related nuclear cataract, right eye: Secondary | ICD-10-CM | POA: Diagnosis not present

## 2022-07-10 DIAGNOSIS — H269 Unspecified cataract: Secondary | ICD-10-CM | POA: Diagnosis not present

## 2022-10-30 DIAGNOSIS — L0103 Bullous impetigo: Secondary | ICD-10-CM | POA: Diagnosis not present

## 2022-10-30 DIAGNOSIS — W57XXXA Bitten or stung by nonvenomous insect and other nonvenomous arthropods, initial encounter: Secondary | ICD-10-CM | POA: Diagnosis not present

## 2022-11-03 DIAGNOSIS — S90562A Insect bite (nonvenomous), left ankle, initial encounter: Secondary | ICD-10-CM | POA: Diagnosis not present

## 2022-11-03 DIAGNOSIS — W57XXXA Bitten or stung by nonvenomous insect and other nonvenomous arthropods, initial encounter: Secondary | ICD-10-CM | POA: Diagnosis not present

## 2022-11-18 DIAGNOSIS — W57XXXD Bitten or stung by nonvenomous insect and other nonvenomous arthropods, subsequent encounter: Secondary | ICD-10-CM | POA: Diagnosis not present

## 2022-11-18 DIAGNOSIS — S90562D Insect bite (nonvenomous), left ankle, subsequent encounter: Secondary | ICD-10-CM | POA: Diagnosis not present

## 2023-03-03 ENCOUNTER — Other Ambulatory Visit: Payer: Self-pay | Admitting: Family Medicine

## 2023-03-03 DIAGNOSIS — Z1231 Encounter for screening mammogram for malignant neoplasm of breast: Secondary | ICD-10-CM

## 2023-04-01 ENCOUNTER — Ambulatory Visit
Admission: RE | Admit: 2023-04-01 | Discharge: 2023-04-01 | Disposition: A | Discharge status: Home / Self Care | Payer: Medicare Other | Source: Ambulatory Visit | Attending: Family Medicine | Admitting: Family Medicine

## 2023-04-01 DIAGNOSIS — Z1231 Encounter for screening mammogram for malignant neoplasm of breast: Secondary | ICD-10-CM | POA: Diagnosis not present

## 2023-04-16 DIAGNOSIS — Z0000 Encounter for general adult medical examination without abnormal findings: Secondary | ICD-10-CM | POA: Diagnosis not present

## 2023-04-16 DIAGNOSIS — D573 Sickle-cell trait: Secondary | ICD-10-CM | POA: Diagnosis not present

## 2023-04-16 DIAGNOSIS — E7800 Pure hypercholesterolemia, unspecified: Secondary | ICD-10-CM | POA: Diagnosis not present

## 2023-04-16 DIAGNOSIS — F322 Major depressive disorder, single episode, severe without psychotic features: Secondary | ICD-10-CM | POA: Diagnosis not present

## 2023-04-16 DIAGNOSIS — Z23 Encounter for immunization: Secondary | ICD-10-CM | POA: Diagnosis not present

## 2023-04-16 DIAGNOSIS — M899 Disorder of bone, unspecified: Secondary | ICD-10-CM | POA: Diagnosis not present

## 2023-04-16 DIAGNOSIS — E041 Nontoxic single thyroid nodule: Secondary | ICD-10-CM | POA: Diagnosis not present

## 2023-07-10 ENCOUNTER — Telehealth: Payer: Self-pay | Admitting: Orthopedic Surgery

## 2023-07-10 NOTE — Telephone Encounter (Signed)
 Pt requesting handicap placard already on [employer]

## 2023-07-13 NOTE — Telephone Encounter (Signed)
IC advised could pick up at front desk.  

## 2023-12-16 ENCOUNTER — Other Ambulatory Visit: Payer: Self-pay | Admitting: Family Medicine

## 2023-12-16 DIAGNOSIS — N644 Mastodynia: Secondary | ICD-10-CM

## 2024-01-06 ENCOUNTER — Ambulatory Visit
Admission: RE | Admit: 2024-01-06 | Discharge: 2024-01-06 | Disposition: A | Discharge status: Home / Self Care | Source: Ambulatory Visit | Attending: Family Medicine | Admitting: Family Medicine

## 2024-01-06 ENCOUNTER — Other Ambulatory Visit: Payer: Self-pay | Admitting: Family Medicine

## 2024-01-06 DIAGNOSIS — N644 Mastodynia: Secondary | ICD-10-CM

## 2024-03-01 ENCOUNTER — Other Ambulatory Visit: Payer: Self-pay | Admitting: Family Medicine

## 2024-03-01 DIAGNOSIS — Z1231 Encounter for screening mammogram for malignant neoplasm of breast: Secondary | ICD-10-CM

## 2024-04-01 ENCOUNTER — Ambulatory Visit
Admission: RE | Admit: 2024-04-01 | Discharge: 2024-04-01 | Disposition: A | Discharge status: Home / Self Care | Source: Ambulatory Visit | Attending: Family Medicine | Admitting: Family Medicine

## 2024-04-01 DIAGNOSIS — Z1231 Encounter for screening mammogram for malignant neoplasm of breast: Secondary | ICD-10-CM

## 2024-05-02 ENCOUNTER — Encounter: Payer: Self-pay | Admitting: Radiology

## 2024-05-06 ENCOUNTER — Other Ambulatory Visit (INDEPENDENT_AMBULATORY_CARE_PROVIDER_SITE_OTHER): Payer: Self-pay

## 2024-05-06 ENCOUNTER — Ambulatory Visit: Admitting: Surgical

## 2024-05-06 DIAGNOSIS — M25561 Pain in right knee: Secondary | ICD-10-CM | POA: Diagnosis not present

## 2024-05-06 DIAGNOSIS — G8929 Other chronic pain: Secondary | ICD-10-CM

## 2024-05-06 DIAGNOSIS — M25562 Pain in left knee: Secondary | ICD-10-CM

## 2024-05-06 DIAGNOSIS — M170 Bilateral primary osteoarthritis of knee: Secondary | ICD-10-CM

## 2024-05-06 DIAGNOSIS — M1712 Unilateral primary osteoarthritis, left knee: Secondary | ICD-10-CM

## 2024-05-08 ENCOUNTER — Encounter: Payer: Self-pay | Admitting: Surgical

## 2024-05-08 MED ORDER — TRIAMCINOLONE ACETONIDE 40 MG/ML IJ SUSP
40.0000 mg | INTRAMUSCULAR | Status: AC | PRN
  Administered 2024-05-06: 40 mg via INTRA_ARTICULAR

## 2024-05-08 MED ORDER — BUPIVACAINE HCL 0.25 % IJ SOLN
4.0000 mL | INTRAMUSCULAR | Status: AC | PRN
  Administered 2024-05-06: 4 mL via INTRA_ARTICULAR

## 2024-05-08 MED ORDER — LIDOCAINE HCL 1 % IJ SOLN
5.0000 mL | INTRAMUSCULAR | Status: AC | PRN
  Administered 2024-05-06: 5 mL

## 2024-05-08 NOTE — Progress Notes (Signed)
 Office Visit Note   Patient: Jessica Lowery           Date of Birth: 03/03/1951           MRN: [id number] Visit Date: 05/06/2024 Requested by: Loreli Kins, MD [address] [place],  [place] [address] PCP: Loreli Kins, MD  Subjective: Chief Complaint  Patient presents with   Right Knee - Pain   Left Knee - Pain    HPI: Jessica Lowery is a 73 y.o. female who presents to the office reporting knee pain.  Has history of knee arthritis.  No new falls or injuries.  No fevers or chills.  Previous injections have provided good relief and they are here today to repeat injections.  Last office visit was 04/25/2022 where she had left knee gel injection that gave her great relief.  She describes left knee bothering her more than her right knee.  Primarily medial pain in both knees.  Pain will occasionally wake her up from sleep at night though this does not happen every night and she cannot really describe what the frequency is.  Will have occasionally buckling of either knee.  Ambulates with a cane to help with the instability.  No swelling.  Takes Tylenol  with some relief.  No groin pain..                ROS: All systems reviewed are negative as they relate to the chief complaint within the history of present illness.  Patient denies fevers or chills.  Assessment & Plan: Visit Diagnoses:  1. Bilateral chronic knee pain   2. Primary osteoarthritis of both knees     Plan: Impression is 73 year old female who presents for evaluation of bilateral knee pain.  Has new radiographs taken today demonstrating tricompartmental arthritis of the left knee with severe degenerative changes of the medial compartment.  This is the main knee that is giving her difficulty though she is having pain in both knees.  She would like to try cortisone injection today.  This was administered without complication and patient tolerated procedure well.  We will see how this does for her and also plan to  preapproved bilateral knee gel injections for her for when this cortisone injection wears off.  She responded very well to the last gel injection which gave her about a year of relief.  Follow-up as needed.  Follow-Up Instructions: No follow-ups on file.   Orders:  Orders Placed This Encounter  Procedures   XR KNEE 3 VIEW RIGHT   XR KNEE 3 VIEW LEFT   Ambulatory request for injection medication   No orders of the defined types were placed in this encounter.     Procedures: Large Joint Inj: L knee on 05/06/2024 11:20 AM Indications: diagnostic evaluation, joint swelling and pain Details: 18 G 1.5 in needle, superolateral approach  Arthrogram: No  Medications: 5 mL lidocaine  1 %; 4 mL bupivacaine  0.25 %; 40 mg triamcinolone  acetonide 40 MG/ML Outcome: tolerated well, no immediate complications Procedure, treatment alternatives, risks and benefits explained, specific risks discussed. Consent was given by the patient. Immediately prior to procedure a time out was called to verify the correct patient, procedure, equipment, support staff and site/side marked as required. Patient was prepped and draped in the usual sterile fashion.       Clinical Data: No additional findings.  Objective: Vital Signs: There were no vitals taken for this visit.  Physical Exam:  Constitutional: Patient appears well-developed HEENT:  Head: Normocephalic Eyes:EOM are normal Neck: Normal range of motion Cardiovascular: Normal rate Pulmonary/chest: Effort normal Neurologic: Patient is alert Skin: Skin is warm Psychiatric: Patient has normal mood and affect  Ortho Exam: Ortho exam demonstrates knees without cellulitis or skin changes.  Effusion not present in either knee.  No calf tenderness.  Negative Homans' sign.  No pain with hip range of motion.  Able to perform straight leg raise with both lower extremities.  Lower extremities warm and well-perfused.  Tender over the medial joint line of both  knees, particularly in the left knee.  No lateral joint line tenderness in the right knee but mild tenderness in the left knee.  Has well-preserved range of motion bilaterally.  Specialty Comments:  No specialty comments available.  Imaging: No results found.   PMFS History: There are no active problems to display for this patient.  No past medical history on file.  Family History  Problem Relation Age of Onset   Dementia Mother    Breast cancer Neg Hx     Past Surgical History:  Procedure Laterality Date   FOOT SURGERY Left    Social History   Occupational History   Not on file  Tobacco Use   Smoking status: Former    Current packs/day: 0.00    Types: Cigarettes    Start date: 06/30/1962    Quit date: 06/30/1986    Years since quitting: 37.8   Smokeless tobacco: Never  Vaping Use   Vaping status: Never Used  Substance and Sexual Activity   Alcohol use: No   Drug use: No   Sexual activity: Not on file

## 2024-06-15 ENCOUNTER — Ambulatory Visit: Admitting: Orthopedic Surgery

## 2024-06-15 ENCOUNTER — Encounter: Payer: Self-pay | Admitting: Orthopedic Surgery

## 2024-06-15 DIAGNOSIS — M170 Bilateral primary osteoarthritis of knee: Secondary | ICD-10-CM

## 2024-06-15 NOTE — Progress Notes (Unsigned)
° °  Procedure Note  Patient: Jessica Lowery             Date of Birth: Nov 13, 1950           MRN: [id number]             Visit Date: 06/15/2024  Procedures: Visit Diagnoses:  1. Primary osteoarthritis of both knees     Large Joint Inj: bilateral knee on 06/15/2024 8:58 AM Indications: pain, joint swelling and diagnostic evaluation Details: 18 G 1.5 in needle, superolateral approach  Arthrogram: No  Outcome: tolerated well, no immediate complications Procedure, treatment alternatives, risks and benefits explained, specific risks discussed. Consent was given by the patient. Immediately prior to procedure a time out was called to verify the correct patient, procedure, equipment, support staff and site/side marked as required. Patient was prepped and draped in the usual sterile fashion.     Lot #[id number]
